# Patient Record
Sex: Male | Born: 1980 | Race: White | Hispanic: No | Marital: Single | Smoking: Never smoker
Health system: Southern US, Community
[De-identification: ages and names within clinical notes are randomized; demographics above are authoritative.]

## PROBLEM LIST (undated history)

## (undated) DIAGNOSIS — D6859 Other primary thrombophilia: Secondary | ICD-10-CM

## (undated) DIAGNOSIS — E119 Type 2 diabetes mellitus without complications: Secondary | ICD-10-CM

## (undated) DIAGNOSIS — I82409 Acute embolism and thrombosis of unspecified deep veins of unspecified lower extremity: Secondary | ICD-10-CM

## (undated) HISTORY — PX: OTHER SURGICAL HISTORY: SHX169

---

## 2007-06-07 ENCOUNTER — Encounter (INDEPENDENT_AMBULATORY_CARE_PROVIDER_SITE_OTHER): Payer: Self-pay | Admitting: *Deleted

## 2012-03-04 LAB — PROTIME-INR

## 2015-10-04 DIAGNOSIS — Z6827 Body mass index (BMI) 27.0-27.9, adult: Secondary | ICD-10-CM | POA: Diagnosis not present

## 2015-10-04 DIAGNOSIS — E782 Mixed hyperlipidemia: Secondary | ICD-10-CM | POA: Diagnosis not present

## 2015-10-04 DIAGNOSIS — D6859 Other primary thrombophilia: Secondary | ICD-10-CM | POA: Diagnosis not present

## 2015-10-04 DIAGNOSIS — E119 Type 2 diabetes mellitus without complications: Secondary | ICD-10-CM | POA: Diagnosis not present

## 2016-01-04 DIAGNOSIS — E782 Mixed hyperlipidemia: Secondary | ICD-10-CM | POA: Diagnosis not present

## 2016-01-04 DIAGNOSIS — Z1389 Encounter for screening for other disorder: Secondary | ICD-10-CM | POA: Diagnosis not present

## 2016-01-04 DIAGNOSIS — Z6827 Body mass index (BMI) 27.0-27.9, adult: Secondary | ICD-10-CM | POA: Diagnosis not present

## 2016-01-04 DIAGNOSIS — E119 Type 2 diabetes mellitus without complications: Secondary | ICD-10-CM | POA: Diagnosis not present

## 2016-04-03 DIAGNOSIS — E119 Type 2 diabetes mellitus without complications: Secondary | ICD-10-CM | POA: Diagnosis not present

## 2016-04-03 DIAGNOSIS — Z6827 Body mass index (BMI) 27.0-27.9, adult: Secondary | ICD-10-CM | POA: Diagnosis not present

## 2016-07-03 DIAGNOSIS — E119 Type 2 diabetes mellitus without complications: Secondary | ICD-10-CM | POA: Diagnosis not present

## 2016-07-03 DIAGNOSIS — Z6827 Body mass index (BMI) 27.0-27.9, adult: Secondary | ICD-10-CM | POA: Diagnosis not present

## 2016-07-03 DIAGNOSIS — E663 Overweight: Secondary | ICD-10-CM | POA: Diagnosis not present

## 2016-07-03 DIAGNOSIS — E7800 Pure hypercholesterolemia, unspecified: Secondary | ICD-10-CM | POA: Diagnosis not present

## 2016-07-07 DIAGNOSIS — E119 Type 2 diabetes mellitus without complications: Secondary | ICD-10-CM | POA: Diagnosis not present

## 2016-08-08 DIAGNOSIS — R748 Abnormal levels of other serum enzymes: Secondary | ICD-10-CM | POA: Diagnosis not present

## 2016-10-02 DIAGNOSIS — E785 Hyperlipidemia, unspecified: Secondary | ICD-10-CM | POA: Diagnosis not present

## 2016-10-02 DIAGNOSIS — E663 Overweight: Secondary | ICD-10-CM | POA: Diagnosis not present

## 2016-10-02 DIAGNOSIS — E119 Type 2 diabetes mellitus without complications: Secondary | ICD-10-CM | POA: Diagnosis not present

## 2016-10-02 DIAGNOSIS — Z6827 Body mass index (BMI) 27.0-27.9, adult: Secondary | ICD-10-CM | POA: Diagnosis not present

## 2017-01-01 DIAGNOSIS — R748 Abnormal levels of other serum enzymes: Secondary | ICD-10-CM | POA: Diagnosis not present

## 2017-01-01 DIAGNOSIS — E7800 Pure hypercholesterolemia, unspecified: Secondary | ICD-10-CM | POA: Diagnosis not present

## 2017-01-01 DIAGNOSIS — R791 Abnormal coagulation profile: Secondary | ICD-10-CM | POA: Diagnosis not present

## 2017-01-01 DIAGNOSIS — E1169 Type 2 diabetes mellitus with other specified complication: Secondary | ICD-10-CM | POA: Diagnosis not present

## 2017-01-03 DIAGNOSIS — E875 Hyperkalemia: Secondary | ICD-10-CM | POA: Diagnosis not present

## 2017-04-05 DIAGNOSIS — E1169 Type 2 diabetes mellitus with other specified complication: Secondary | ICD-10-CM | POA: Diagnosis not present

## 2017-04-05 DIAGNOSIS — J101 Influenza due to other identified influenza virus with other respiratory manifestations: Secondary | ICD-10-CM | POA: Diagnosis not present

## 2017-04-05 DIAGNOSIS — R748 Abnormal levels of other serum enzymes: Secondary | ICD-10-CM | POA: Diagnosis not present

## 2017-04-05 DIAGNOSIS — R791 Abnormal coagulation profile: Secondary | ICD-10-CM | POA: Diagnosis not present

## 2017-04-05 DIAGNOSIS — E7800 Pure hypercholesterolemia, unspecified: Secondary | ICD-10-CM | POA: Diagnosis not present

## 2017-05-02 DIAGNOSIS — Z0000 Encounter for general adult medical examination without abnormal findings: Secondary | ICD-10-CM | POA: Diagnosis not present

## 2017-05-02 DIAGNOSIS — E663 Overweight: Secondary | ICD-10-CM | POA: Diagnosis not present

## 2017-05-02 DIAGNOSIS — Z23 Encounter for immunization: Secondary | ICD-10-CM | POA: Diagnosis not present

## 2017-05-02 DIAGNOSIS — Z1331 Encounter for screening for depression: Secondary | ICD-10-CM | POA: Diagnosis not present

## 2017-07-30 DIAGNOSIS — E1169 Type 2 diabetes mellitus with other specified complication: Secondary | ICD-10-CM | POA: Diagnosis not present

## 2017-07-30 DIAGNOSIS — E7800 Pure hypercholesterolemia, unspecified: Secondary | ICD-10-CM | POA: Diagnosis not present

## 2017-07-30 DIAGNOSIS — R791 Abnormal coagulation profile: Secondary | ICD-10-CM | POA: Diagnosis not present

## 2017-07-30 DIAGNOSIS — R748 Abnormal levels of other serum enzymes: Secondary | ICD-10-CM | POA: Diagnosis not present

## 2017-09-28 DIAGNOSIS — R5381 Other malaise: Secondary | ICD-10-CM | POA: Diagnosis not present

## 2017-09-28 DIAGNOSIS — W57XXXA Bitten or stung by nonvenomous insect and other nonvenomous arthropods, initial encounter: Secondary | ICD-10-CM | POA: Diagnosis not present

## 2017-11-13 DIAGNOSIS — E1169 Type 2 diabetes mellitus with other specified complication: Secondary | ICD-10-CM | POA: Diagnosis not present

## 2017-11-13 DIAGNOSIS — R748 Abnormal levels of other serum enzymes: Secondary | ICD-10-CM | POA: Diagnosis not present

## 2017-11-13 DIAGNOSIS — R791 Abnormal coagulation profile: Secondary | ICD-10-CM | POA: Diagnosis not present

## 2017-11-13 DIAGNOSIS — E7800 Pure hypercholesterolemia, unspecified: Secondary | ICD-10-CM | POA: Diagnosis not present

## 2018-02-13 DIAGNOSIS — Z23 Encounter for immunization: Secondary | ICD-10-CM | POA: Diagnosis not present

## 2018-02-13 DIAGNOSIS — E7800 Pure hypercholesterolemia, unspecified: Secondary | ICD-10-CM | POA: Diagnosis not present

## 2018-02-13 DIAGNOSIS — E1169 Type 2 diabetes mellitus with other specified complication: Secondary | ICD-10-CM | POA: Diagnosis not present

## 2018-02-13 DIAGNOSIS — R791 Abnormal coagulation profile: Secondary | ICD-10-CM | POA: Diagnosis not present

## 2018-10-11 DIAGNOSIS — Z20828 Contact with and (suspected) exposure to other viral communicable diseases: Secondary | ICD-10-CM | POA: Diagnosis not present

## 2018-10-11 DIAGNOSIS — Z03818 Encounter for observation for suspected exposure to other biological agents ruled out: Secondary | ICD-10-CM | POA: Diagnosis not present

## 2019-03-05 DIAGNOSIS — E119 Type 2 diabetes mellitus without complications: Secondary | ICD-10-CM | POA: Diagnosis not present

## 2019-03-28 DIAGNOSIS — Z23 Encounter for immunization: Secondary | ICD-10-CM | POA: Diagnosis not present

## 2019-04-03 DIAGNOSIS — Z6826 Body mass index (BMI) 26.0-26.9, adult: Secondary | ICD-10-CM | POA: Diagnosis not present

## 2019-04-03 DIAGNOSIS — H1030 Unspecified acute conjunctivitis, unspecified eye: Secondary | ICD-10-CM | POA: Diagnosis not present

## 2019-04-14 DIAGNOSIS — Z20828 Contact with and (suspected) exposure to other viral communicable diseases: Secondary | ICD-10-CM | POA: Diagnosis not present

## 2019-04-14 DIAGNOSIS — J3489 Other specified disorders of nose and nasal sinuses: Secondary | ICD-10-CM | POA: Diagnosis not present

## 2019-04-14 DIAGNOSIS — R0981 Nasal congestion: Secondary | ICD-10-CM | POA: Diagnosis not present

## 2019-05-02 DIAGNOSIS — D6859 Other primary thrombophilia: Secondary | ICD-10-CM | POA: Diagnosis not present

## 2019-05-02 DIAGNOSIS — E7800 Pure hypercholesterolemia, unspecified: Secondary | ICD-10-CM | POA: Diagnosis not present

## 2019-05-02 DIAGNOSIS — E1169 Type 2 diabetes mellitus with other specified complication: Secondary | ICD-10-CM | POA: Diagnosis not present

## 2019-05-02 DIAGNOSIS — E1165 Type 2 diabetes mellitus with hyperglycemia: Secondary | ICD-10-CM | POA: Diagnosis not present

## 2019-05-16 DIAGNOSIS — E875 Hyperkalemia: Secondary | ICD-10-CM | POA: Diagnosis not present

## 2019-06-03 DIAGNOSIS — R7989 Other specified abnormal findings of blood chemistry: Secondary | ICD-10-CM | POA: Diagnosis not present

## 2019-08-22 DIAGNOSIS — E1169 Type 2 diabetes mellitus with other specified complication: Secondary | ICD-10-CM | POA: Diagnosis not present

## 2019-08-22 DIAGNOSIS — D6859 Other primary thrombophilia: Secondary | ICD-10-CM | POA: Diagnosis not present

## 2019-08-22 DIAGNOSIS — E7800 Pure hypercholesterolemia, unspecified: Secondary | ICD-10-CM | POA: Diagnosis not present

## 2019-08-22 DIAGNOSIS — E1165 Type 2 diabetes mellitus with hyperglycemia: Secondary | ICD-10-CM | POA: Diagnosis not present

## 2019-08-22 DIAGNOSIS — R12 Heartburn: Secondary | ICD-10-CM | POA: Diagnosis not present

## 2019-11-21 DIAGNOSIS — U071 COVID-19: Secondary | ICD-10-CM | POA: Diagnosis not present

## 2019-11-25 ENCOUNTER — Emergency Department (HOSPITAL_COMMUNITY): Payer: BC Managed Care – PPO

## 2019-11-25 ENCOUNTER — Other Ambulatory Visit: Payer: Self-pay

## 2019-11-25 ENCOUNTER — Inpatient Hospital Stay (HOSPITAL_COMMUNITY)
Admission: EM | Admit: 2019-11-25 | Discharge: 2019-12-01 | DRG: 871 | Disposition: A | Discharge status: Home / Self Care | Payer: BC Managed Care – PPO | Attending: Internal Medicine | Admitting: Internal Medicine

## 2019-11-25 ENCOUNTER — Encounter (HOSPITAL_COMMUNITY): Payer: Self-pay | Admitting: Family Medicine

## 2019-11-25 DIAGNOSIS — D6859 Other primary thrombophilia: Secondary | ICD-10-CM | POA: Diagnosis not present

## 2019-11-25 DIAGNOSIS — A4189 Other specified sepsis: Principal | ICD-10-CM | POA: Diagnosis present

## 2019-11-25 DIAGNOSIS — T380X5A Adverse effect of glucocorticoids and synthetic analogues, initial encounter: Secondary | ICD-10-CM | POA: Diagnosis present

## 2019-11-25 DIAGNOSIS — Z79899 Other long term (current) drug therapy: Secondary | ICD-10-CM | POA: Diagnosis not present

## 2019-11-25 DIAGNOSIS — J9600 Acute respiratory failure, unspecified whether with hypoxia or hypercapnia: Secondary | ICD-10-CM | POA: Diagnosis not present

## 2019-11-25 DIAGNOSIS — J069 Acute upper respiratory infection, unspecified: Secondary | ICD-10-CM

## 2019-11-25 DIAGNOSIS — E1165 Type 2 diabetes mellitus with hyperglycemia: Secondary | ICD-10-CM | POA: Diagnosis not present

## 2019-11-25 DIAGNOSIS — E785 Hyperlipidemia, unspecified: Secondary | ICD-10-CM

## 2019-11-25 DIAGNOSIS — U071 COVID-19: Secondary | ICD-10-CM

## 2019-11-25 DIAGNOSIS — J189 Pneumonia, unspecified organism: Secondary | ICD-10-CM | POA: Diagnosis not present

## 2019-11-25 DIAGNOSIS — R918 Other nonspecific abnormal finding of lung field: Secondary | ICD-10-CM | POA: Diagnosis not present

## 2019-11-25 DIAGNOSIS — J1282 Pneumonia due to coronavirus disease 2019: Secondary | ICD-10-CM

## 2019-11-25 DIAGNOSIS — Z7984 Long term (current) use of oral hypoglycemic drugs: Secondary | ICD-10-CM | POA: Diagnosis not present

## 2019-11-25 DIAGNOSIS — J9601 Acute respiratory failure with hypoxia: Secondary | ICD-10-CM | POA: Diagnosis present

## 2019-11-25 DIAGNOSIS — E1169 Type 2 diabetes mellitus with other specified complication: Secondary | ICD-10-CM | POA: Diagnosis not present

## 2019-11-25 DIAGNOSIS — Z86718 Personal history of other venous thrombosis and embolism: Secondary | ICD-10-CM

## 2019-11-25 DIAGNOSIS — R0602 Shortness of breath: Secondary | ICD-10-CM | POA: Diagnosis not present

## 2019-11-25 DIAGNOSIS — A419 Sepsis, unspecified organism: Secondary | ICD-10-CM | POA: Diagnosis not present

## 2019-11-25 DIAGNOSIS — R000 Tachycardia, unspecified: Secondary | ICD-10-CM | POA: Diagnosis not present

## 2019-11-25 HISTORY — DX: Other primary thrombophilia: D68.59

## 2019-11-25 HISTORY — DX: Acute embolism and thrombosis of unspecified deep veins of unspecified lower extremity: I82.409

## 2019-11-25 HISTORY — DX: Type 2 diabetes mellitus without complications: E11.9

## 2019-11-25 LAB — CBC WITH DIFFERENTIAL/PLATELET
Abs Immature Granulocytes: 0.03 10*3/uL (ref 0.00–0.07)
Basophils Absolute: 0 10*3/uL (ref 0.0–0.1)
Basophils Relative: 0 %
Eosinophils Absolute: 0 10*3/uL (ref 0.0–0.5)
Eosinophils Relative: 0 %
HCT: 45.4 % (ref 39.0–52.0)
Hemoglobin: 15.5 g/dL (ref 13.0–17.0)
Immature Granulocytes: 1 %
Lymphocytes Relative: 10 %
Lymphs Abs: 0.7 10*3/uL (ref 0.7–4.0)
MCH: 28.9 pg (ref 26.0–34.0)
MCHC: 34.1 g/dL (ref 30.0–36.0)
MCV: 84.5 fL (ref 80.0–100.0)
Monocytes Absolute: 0.4 10*3/uL (ref 0.1–1.0)
Monocytes Relative: 6 %
Neutro Abs: 5.4 10*3/uL (ref 1.7–7.7)
Neutrophils Relative %: 83 %
Platelets: 152 10*3/uL (ref 150–400)
RBC: 5.37 MIL/uL (ref 4.22–5.81)
RDW: 11.9 % (ref 11.5–15.5)
WBC: 6.4 10*3/uL (ref 4.0–10.5)
nRBC: 0 % (ref 0.0–0.2)

## 2019-11-25 LAB — COMPREHENSIVE METABOLIC PANEL
ALT: 65 U/L — ABNORMAL HIGH (ref 0–44)
AST: 38 U/L (ref 15–41)
Albumin: 3.5 g/dL (ref 3.5–5.0)
Alkaline Phosphatase: 48 U/L (ref 38–126)
Anion gap: 11 (ref 5–15)
BUN: 13 mg/dL (ref 6–20)
CO2: 25 mmol/L (ref 22–32)
Calcium: 8.4 mg/dL — ABNORMAL LOW (ref 8.9–10.3)
Chloride: 97 mmol/L — ABNORMAL LOW (ref 98–111)
Creatinine, Ser: 0.71 mg/dL (ref 0.61–1.24)
GFR calc Af Amer: >60 mL/min (ref >60)
GFR calc non Af Amer: >60 mL/min (ref >60)
Glucose, Bld: 235 mg/dL — ABNORMAL HIGH (ref 70–99)
Potassium: 4.3 mmol/L (ref 3.5–5.1)
Sodium: 133 mmol/L — ABNORMAL LOW (ref 135–145)
Total Bilirubin: 1 mg/dL (ref 0.3–1.2)
Total Protein: 7 g/dL (ref 6.5–8.1)

## 2019-11-25 LAB — CBG MONITORING, ED: Glucose-Capillary: 316 mg/dL — ABNORMAL HIGH (ref 70–99)

## 2019-11-25 LAB — FIBRINOGEN: Fibrinogen: 629 mg/dL — ABNORMAL HIGH (ref 210–475)

## 2019-11-25 LAB — LACTATE DEHYDROGENASE: LDH: 350 U/L — ABNORMAL HIGH (ref 98–192)

## 2019-11-25 LAB — C-REACTIVE PROTEIN: CRP: 6.1 mg/dL — ABNORMAL HIGH (ref <1.0)

## 2019-11-25 LAB — LACTIC ACID, PLASMA: Lactic Acid, Venous: 1.9 mmol/L (ref 0.5–1.9)

## 2019-11-25 LAB — D-DIMER, QUANTITATIVE: D-Dimer, Quant: 1.24 ug/mL-FEU — ABNORMAL HIGH (ref 0.00–0.50)

## 2019-11-25 LAB — PROCALCITONIN: Procalcitonin: <0.1 ng/mL

## 2019-11-25 LAB — FERRITIN: Ferritin: 766 ng/mL — ABNORMAL HIGH (ref 24–336)

## 2019-11-25 LAB — TRIGLYCERIDES: Triglycerides: 87 mg/dL (ref <150)

## 2019-11-25 MED ORDER — RIVAROXABAN 10 MG PO TABS
10.0000 mg | ORAL_TABLET | Freq: Every day | ORAL | Status: DC
  Administered 2019-11-26: 10 mg via ORAL
  Filled 2019-11-25: qty 1

## 2019-11-25 MED ORDER — ACETAMINOPHEN 325 MG PO TABS: 650.0000 mg | ORAL_TABLET | Freq: Four times a day (QID) | ORAL | Status: DC | PRN

## 2019-11-25 MED ORDER — INSULIN ASPART 100 UNIT/ML ~~LOC~~ SOLN
0.0000 [IU] | Freq: Three times a day (TID) | SUBCUTANEOUS | Status: DC
  Administered 2019-11-26: 5 [IU] via SUBCUTANEOUS
  Filled 2019-11-25: qty 0.15

## 2019-11-25 MED ORDER — INSULIN ASPART 100 UNIT/ML ~~LOC~~ SOLN
0.0000 [IU] | Freq: Every day | SUBCUTANEOUS | Status: DC
  Administered 2019-11-25: 4 [IU] via SUBCUTANEOUS
  Filled 2019-11-25: qty 0.05

## 2019-11-25 MED ORDER — SODIUM CHLORIDE 0.9 % IV SOLN
100.0000 mg | Freq: Every day | INTRAVENOUS | Status: AC
  Administered 2019-11-26 – 2019-11-29 (×4): 100 mg via INTRAVENOUS
  Filled 2019-11-25 (×4): qty 20

## 2019-11-25 MED ORDER — DEXAMETHASONE SODIUM PHOSPHATE 10 MG/ML IJ SOLN
6.0000 mg | INTRAMUSCULAR | Status: DC
  Administered 2019-11-26: 6 mg via INTRAVENOUS
  Filled 2019-11-25: qty 1

## 2019-11-25 MED ORDER — PANTOPRAZOLE SODIUM 40 MG PO TBEC
40.0000 mg | DELAYED_RELEASE_TABLET | Freq: Every day | ORAL | Status: DC
  Administered 2019-11-25 – 2019-12-01 (×7): 40 mg via ORAL
  Filled 2019-11-25 (×7): qty 1

## 2019-11-25 MED ORDER — ACETAMINOPHEN 500 MG PO TABS
1000.0000 mg | ORAL_TABLET | Freq: Once | ORAL | Status: AC
  Administered 2019-11-25: 1000 mg via ORAL
  Filled 2019-11-25: qty 2

## 2019-11-25 MED ORDER — SODIUM CHLORIDE 0.9 % IV SOLN
100.0000 mg | INTRAVENOUS | Status: AC
  Administered 2019-11-25 (×2): 100 mg via INTRAVENOUS
  Filled 2019-11-25 (×2): qty 20

## 2019-11-25 MED ORDER — ENOXAPARIN SODIUM 40 MG/0.4ML ~~LOC~~ SOLN: 40.0000 mg | SUBCUTANEOUS | Status: DC

## 2019-11-25 MED ORDER — ROSUVASTATIN CALCIUM 20 MG PO TABS
20.0000 mg | ORAL_TABLET | Freq: Every day | ORAL | Status: DC
  Administered 2019-11-25 – 2019-11-30 (×6): 20 mg via ORAL
  Filled 2019-11-25 (×6): qty 1

## 2019-11-25 MED ORDER — DEXAMETHASONE SODIUM PHOSPHATE 10 MG/ML IJ SOLN
6.0000 mg | Freq: Once | INTRAMUSCULAR | Status: AC
  Administered 2019-11-25: 6 mg via INTRAVENOUS
  Filled 2019-11-25: qty 1

## 2019-11-25 MED ORDER — SODIUM CHLORIDE 0.9 % IV SOLN: INTRAVENOUS | Status: DC

## 2019-11-25 NOTE — ED Provider Notes (Signed)
[facility]-EMERGENCY DEPT Provider Note   CSN: [id number] Arrival date & time: 11/25/19  1748     History Chief Complaint  Patient presents with  . Shortness of Breath  . COVID 19    Eric Kirby is a 39 y.o. male. 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 Reports that he has had symptoms for approximately 1 week, initially malaise, decreased energy then worsening cough. Over the past couple days has had some increased shortness of breath. Had been monitoring his oxygen levels at home with home pulse oximetry. Initially normal but today they have been dropping down into the 80s. No associated chest pain. Urgent care diagnosed with Covid.  HPI     Past Medical History:  Diagnosis Date  . Diabetes mellitus without complication (HCC)   . DVT (deep venous thrombosis) (HCC)    pt states hx of dvt in leg  . Protein S deficiency [facility])     Patient Active Problem List   Diagnosis Date Noted  . Pneumonia due to COVID-19 virus 11/25/2019  . Acute respiratory failure due to COVID-19 (HCC) 11/25/2019  . Sepsis (HCC) 11/25/2019  . Protein S deficiency (HCC) 11/25/2019  . Type 2 diabetes mellitus with hyperlipidemia (HCC) 11/25/2019    History reviewed. No pertinent family history.  Social History   Tobacco Use  . Smoking status: Never Smoker  . Smokeless tobacco: Never Used  Vaping Use  . Vaping Use: Never used  Substance Use Topics  . Alcohol use: Not Currently  . Drug use: Not Currently    Home Medications Prior to Admission medications   Medication Sig Start Date End Date Taking? Authorizing Provider  albuterol (VENTOLIN HFA) 108 (90 Base) MCG/ACT inhaler Inhale 1 puff into the lungs every 6 (six) hours as needed for wheezing or shortness of breath.   Yes [provider]  glipiZIDE (GLUCOTROL XL) 10 MG 24 hr tablet Take 10 mg by mouth daily. 10/24/19  Yes [provider]  omeprazole (PRILOSEC) 40 MG capsule Take 40 mg by mouth daily. 08/22/19  Yes [provider]  rosuvastatin (CRESTOR) 20 MG tablet Take 20 mg by mouth at bedtime. 11/07/19  Yes [provider]  TRULICITY 4.5 MG/0.5ML SOPN Inject 4.5 mg into the skin once a week.  11/09/19  Yes [provider]    Allergies    Patient has no known allergies.  Review of Systems   Review of Systems  Constitutional: Positive for chills, fatigue and fever.  HENT: Negative for ear pain and sore throat.   Eyes: Negative for pain and visual disturbance.  Respiratory: Positive for cough and shortness of breath.   Cardiovascular: Negative for chest pain and palpitations.  Gastrointestinal: Negative for abdominal pain and vomiting.  Genitourinary: Negative for dysuria and hematuria.  Musculoskeletal: Negative for arthralgias and back pain.  Skin: Negative for color change and rash.  Neurological: Negative for seizures and syncope.  All other systems reviewed and are negative.   Physical Exam Updated Vital Signs BP 128/72   Pulse (!) 108   Temp 99.3 F (37.4 C)   Resp (!) 37   Ht 6' (1.829 m)   Wt 90.8 kg   SpO2 92%   BMI 27.15 kg/m   Physical Exam Vitals and nursing note reviewed.  Constitutional:      Appearance: He is well-developed.  HENT:     Head: Normocephalic and atraumatic.  Eyes:     Conjunctiva/sclera: Conjunctivae normal.  Cardiovascular:     Rate and Rhythm: Regular rhythm. Tachycardia present.     Heart sounds: No murmur heard.   Pulmonary:     Effort: No respiratory distress.     Comments: Somewhat coarse breath sounds at bases, mild tachypnea but no respiratory distress Abdominal:     Palpations: Abdomen is soft.     Tenderness: There is no abdominal tenderness.  Musculoskeletal:     Cervical back: Neck supple.  Skin:    General: Skin is warm and dry.     Capillary Refill: Capillary refill takes less than 2 seconds.  Neurological:       General: No focal deficit present.     Mental Status: He is alert and oriented to person, place, and time.     ED Results / Procedures / Treatments   Labs (all labs ordered are listed, but only abnormal results are displayed) Labs Reviewed  COMPREHENSIVE METABOLIC PANEL - Abnormal; Notable for the following components:      Result Value   Sodium 133 (*)    Chloride 97 (*)    Glucose, Bld 235 (*)    Calcium 8.4 (*)    ALT 65 (*)    All other components within normal limits  LACTATE DEHYDROGENASE - Abnormal; Notable for the following components:   LDH 350 (*)    All other components within normal limits  FERRITIN - Abnormal; Notable for the following components:   Ferritin 766 (*)    All other components within normal limits  C-REACTIVE PROTEIN - Abnormal; Notable for the following components:   CRP 6.1 (*)    All other components within normal limits  D-DIMER, QUANTITATIVE (NOT AT [facility]) - Abnormal; Notable for the following components:   D-Dimer, Quant 1.24 (*)    All other components within normal limits  FIBRINOGEN - Abnormal; Notable for the following components:   Fibrinogen 629 (*)    All other components within normal limits  CBG MONITORING, ED - Abnormal; Notable for the following components:   Glucose-Capillary 316 (*)    All other components within normal limits  SARS CORONAVIRUS 2 BY RT PCR (HOSPITAL ORDER, PERFORMED IN [place] [facility])  CULTURE, BLOOD (ROUTINE X 2)  CULTURE, BLOOD (ROUTINE X 2)  LACTIC ACID, PLASMA  CBC WITH DIFFERENTIAL/PLATELET  PROCALCITONIN  TRIGLYCERIDES  HIV ANTIBODY (ROUTINE TESTING W REFLEX)  D-DIMER, QUANTITATIVE (NOT AT [facility])  C-REACTIVE PROTEIN  COMPREHENSIVE METABOLIC PANEL  CBC WITH DIFFERENTIAL/PLATELET  HEMOGLOBIN A1C  ABO/RH    EKG EKG Interpretation  Date/Time:  Tuesday November 25 2019 18:29:21 EDT Ventricular Rate:  125 PR Interval:    QRS Duration: 100 QT Interval:  296 QTC Calculation: 427 R  Axis:   55 Text Interpretation: Sinus tachycardia Probable left atrial enlargement Confirmed by Marianna Fuss ([id number]) on 11/25/2019 8:53:00 PM   Radiology DG Chest Port 1 View  Result Date: 11/25/2019 CLINICAL DATA:  Shortness of breath. COVID positive. EXAM: PORTABLE CHEST 1 VIEW COMPARISON:  None. FINDINGS: Decreased lung volumes are seen which is likely secondary to the degree of patient inspiration. Mild infiltrate is seen within the left lung base. There is no evidence of a pleural effusion or pneumothorax. The heart size and mediastinal contours are within normal limits. The visualized skeletal structures are unremarkable. IMPRESSION: Mild left basilar infiltrate. Electronically Signed   By: Aram Candela M.D.   On: 11/25/2019 19:24    Procedures .Critical Care Performed  by: Milagros Loll, MD Authorized by: Milagros Loll, MD   Critical care provider statement:    Critical care time (minutes):  35   Critical care was time spent personally by me on the following activities:  Discussions with consultants, evaluation of patient's response to treatment, examination of patient, ordering and performing treatments and interventions, ordering and review of laboratory studies, ordering and review of radiographic studies, pulse oximetry, re-evaluation of patient's condition, obtaining history from patient or surrogate and review of old charts   (including critical care time)  Medications Ordered in ED Medications  dexamethasone (DECADRON) injection 6 mg (has no administration in time range)  acetaminophen (TYLENOL) tablet 650 mg (has no administration in time range)  remdesivir 100 mg in sodium chloride 0.9 % 100 mL IVPB (100 mg Intravenous New Bag/Given (Non-Interop) 11/25/19 2249)  remdesivir 100 mg in sodium chloride 0.9 % 100 mL IVPB (has no administration in time range)  0.9 %  sodium chloride infusion ( Intravenous New Bag/Given 11/25/19 2250)  insulin aspart (novoLOG)  injection 0-15 Units (has no administration in time range)  insulin aspart (novoLOG) injection 0-5 Units (has no administration in time range)  rosuvastatin (CRESTOR) tablet 20 mg (has no administration in time range)  pantoprazole (PROTONIX) EC tablet 40 mg (has no administration in time range)  rivaroxaban (XARELTO) tablet 10 mg (has no administration in time range)  acetaminophen (TYLENOL) tablet 1,000 mg (1,000 mg Oral Given 11/25/19 1950)  dexamethasone (DECADRON) injection 6 mg (6 mg Intravenous Given 11/25/19 1950)    ED Course  I have reviewed the triage vital signs and the nursing notes.  Pertinent labs & imaging results that were available during my care of the patient were reviewed by me and considered in my medical decision making (see chart for details).    MDM Rules/Calculators/A&P                          39 year old male DVT/protein S deficiency, type 2 diabetes presents to ER with worsening shortness of breath in setting of recent Covid diagnosis.  Currently 2L [place]. On exam patient noted to be mildly hypoxic, not in respiratory distress.  CXR consistent with Covid pneumonia.  Started Decadron, consult hospitalist for admission.  Dr. Cyndia Bent accepting.    Final Clinical Impression(s) / ED Diagnoses Final diagnoses:  COVID-19  Acute respiratory failure with hypoxia [facility])    Rx / DC Orders ED Discharge Orders    None       Milagros Loll, MD 11/25/19 2302

## 2019-11-25 NOTE — H&P (Addendum)
History and Physical    Eric Kirby OMB:[id number] DOB: Apr 09, 1981 DOA: 11/25/2019  PCP: Marlyn Corporal, PA  Patient coming from: Home  I have personally briefly reviewed patient's old medical records in [facility]  Chief Complaint: shortness of breath  HPI: Eric Kirby is a 39 y.o. male with medical history significant for type 2 diabetes, protein S deficiency with history of DVT on Xarelto who presents with concerns of worsening shortness of breath.  Patient started noticing fever on 7/26 and generalized weakness.  He progressively felt worse and wife also started to have similar symptoms.  States he got tested for Covid 2 days following that and was positive.  Also began to notice cough and increasing shortness of breath prompting him to come to the ED today.  He denies any nausea, vomiting or diarrhea.  Has been having decreased p.o. intake.  Patient has not received Covid vaccination.  In the ED, he was febrile up to 100.7, tachycardic up to 120s, tachypneic and was hypoxic down to 85% on room air and placed on 4 L of O2 via nasal cannula. Lab work with no leukocytosis. Sodium of 133, glucose of 235. Normal creatinine of 0.71. Mildly elevated ALT of 65 with normal AST. D-dimer 1.24. CRP of 6.1. Procalcitonin of less than 0.10.  Chest x-ray shows mild left basilar infiltrate.   Review of Systems:  Constitutional: No Weight Change, + Fever ENT/Mouth: No sore throat, No Rhinorrhea Eyes: No Eye Pain, No Vision Changes Cardiovascular: No Chest Pain, + SOB, + Dyspnea on Exertion Respiratory: + Cough, No Sputum, + Dyspnea  Gastrointestinal: No Nausea, No Vomiting, No Diarrhea, No Constipation, No Pain Genitourinary: no Urinary Incontinence Musculoskeletal: No Arthralgias, No Myalgias Skin: No Skin Lesions, No Pruritus, Neuro: no Weakness, No Numbness Psych: No Anxiety/Panic, No Depression, + decrease appetite Heme/Lymph: No Bruising, No Bleeding  Past Medical History:    Diagnosis Date  . Diabetes mellitus without complication (HCC)   . DVT (deep venous thrombosis) (HCC)    pt states hx of dvt in leg  . Protein S deficiency [facility]     Surgical history History of right foot fracture repair   reports that he has never smoked. He has never used smokeless tobacco. He reports previous alcohol use. He reports previous drug use. Social History  No Known Allergies  Family history Wife also positive for Covid.  Prior to Admission medications   Medication Sig Start Date End Date Taking? Authorizing Provider  albuterol (VENTOLIN HFA) 108 (90 Base) MCG/ACT inhaler Inhale 1 puff into the lungs every 6 (six) hours as needed for wheezing or shortness of breath.   Yes [provider]  glipiZIDE (GLUCOTROL XL) 10 MG 24 hr tablet Take 10 mg by mouth daily. 10/24/19  Yes [provider]  omeprazole (PRILOSEC) 40 MG capsule Take 40 mg by mouth daily. 08/22/19  Yes [provider]  rosuvastatin (CRESTOR) 20 MG tablet Take 20 mg by mouth at bedtime. 11/07/19  Yes [provider]  TRULICITY 4.5 MG/0.5ML SOPN Inject 4.5 mg into the skin once a week.  11/09/19  Yes [provider]    Physical Exam: Vitals:   11/25/19 1830 11/25/19 1941 11/25/19 2000 11/25/19 2100  BP: 139/88 127/69 128/74 (!) 137/107  Pulse: (!) 125 (!) 123 (!) 121 (!) 108  Resp: (!) 23 (!) 26 19 (!) 32  Temp:    99.3 F (37.4 C)  TempSrc:      SpO2: 96% 92% 93% 93%  Weight:      Height:        Constitutional: NAD, calm, comfortable, nontoxic but ill-appearing diaphoretic male laying at 40 degree incline in bed Vitals:   11/25/19 1830 11/25/19 1941 11/25/19 2000 11/25/19 2100  BP: 139/88 127/69 128/74 (!) 137/107  Pulse: (!) 125 (!) 123 (!) 121 (!) 108  Resp: (!) 23 (!) 26 19 (!) 32  Temp:    99.3 F (37.4 C)  TempSrc:      SpO2: 96% 92% 93% 93%  Weight:      Height:       Eyes: PERRL, lids and conjunctivae normal ENMT: Mucous membranes are  moist.  Neck: normal, supple Respiratory: clear to auscultation bilaterally, no wheezing, no crackles. Normal respiratory effort on 2 L of O2 via nasal cannula. No accessory muscle use.  Cardiovascular: Sinus tachycardia on telemetry, no murmurs / rubs / gallops. No extremity edema. 2+ pedal pulses. No carotid bruits.  Abdomen: no tenderness, no masses palpated.  Bowel sounds positive.  Musculoskeletal: no clubbing / cyanosis. No joint deformity upper and lower extremities. Good ROM, no contractures. Normal muscle tone.  Skin: no rashes, lesions, ulcers. No induration Neurologic: CN 2-12 grossly intact. Sensation intact,Strength 5/5 in all 4.  Psychiatric: Normal judgment and insight. Alert and oriented x 3. Normal mood.    Labs on Admission: I have personally reviewed following labs and imaging studies  CBC: Recent Labs  Lab 11/25/19 1848  WBC 6.4  NEUTROABS 5.4  HGB 15.5  HCT 45.4  MCV 84.5  PLT 152   Basic Metabolic Panel: Recent Labs  Lab 11/25/19 1848  NA 133*  K 4.3  CL 97*  CO2 25  GLUCOSE 235*  BUN 13  CREATININE 0.71  CALCIUM 8.4*   GFR: Estimated Creatinine Clearance: 140.1 mL/min (by C-G formula based on SCr of 0.71 mg/dL). Liver Function Tests: Recent Labs  Lab 11/25/19 1848  AST 38  ALT 65*  ALKPHOS 48  BILITOT 1.0  PROT 7.0  ALBUMIN 3.5   No results for input(s): LIPASE, AMYLASE in the last 168 hours. No results for input(s): AMMONIA in the last 168 hours. Coagulation Profile: No results for input(s): INR, PROTIME in the last 168 hours. Cardiac Enzymes: No results for input(s): CKTOTAL, CKMB, CKMBINDEX, TROPONINI in the last 168 hours. BNP (last 3 results) No results for input(s): PROBNP in the last 8760 hours. HbA1C: No results for input(s): HGBA1C in the last 72 hours. CBG: No results for input(s): GLUCAP in the last 168 hours. Lipid Profile: Recent Labs    11/25/19 1848  TRIG 87   Thyroid Function Tests: No results for input(s):  TSH, T4TOTAL, FREET4, T3FREE, THYROIDAB in the last 72 hours. Anemia Panel: Recent Labs    11/25/19 1848  FERRITIN 766*   Urine analysis: No results found for: COLORURINE, APPEARANCEUR, LABSPEC, PHURINE, GLUCOSEU, HGBUR, BILIRUBINUR, KETONESUR, PROTEINUR, UROBILINOGEN, NITRITE, LEUKOCYTESUR  Radiological Exams on Admission: DG Chest Port 1 View  Result Date: 11/25/2019 CLINICAL DATA:  Shortness of breath. COVID positive. EXAM: PORTABLE CHEST 1 VIEW COMPARISON:  None. FINDINGS: Decreased lung volumes are seen which is likely secondary to the degree of patient inspiration. Mild infiltrate is seen within the left lung base. There is no evidence of a pleural effusion or pneumothorax. The heart size and mediastinal contours are within normal limits. The visualized skeletal structures are unremarkable. IMPRESSION: Mild left basilar infiltrate. Electronically Signed   By: Aram Candela M.D.   On: 11/25/2019 19:24  Assessment/Plan  Acute hypoxic respiratory failure secondary to COVID pneumonia On 2L  Maintain O2 > 92%  IV Decadron  Remdesivir Monitor inflammatory markers- D-dimer is slightly elevated but pt is on Xarelto for protein S deficiency.  Will continue monitor D-dimer trend and hypoxia before considering CTA chest to evaluate for any PE  Sepsis secondary to Covid viral infection Patient is febrile, tachycardic, tachypneic on admission  Protein S deficiency w/hx of DVT Continue Xarelto  Type 2 diabetes Place on moderate sliding scale especially with IV steroid   DVT prophylaxis:.Xarelto  code Status: Full Family Communication: Plan discussed with patient at bedside  disposition Plan: Home with at least 2 midnight stays  Consults called:  Admission status: inpatient requiring at least 2 midnights due to acute hypoxic respiratory failure   Anselm Jungling DO [facility]   If 7PM-7AM, please contact night-coverage [email or web address]   11/25/2019, 9:53 PM

## 2019-11-25 NOTE — ED Notes (Signed)
Monitored pt's oxygen level while ambulating to room.  Pt's oxygen dropped to 85%.  Once in room pt resting oxygen was 88%.

## 2019-11-26 DIAGNOSIS — J9600 Acute respiratory failure, unspecified whether with hypoxia or hypercapnia: Secondary | ICD-10-CM

## 2019-11-26 LAB — CBC WITH DIFFERENTIAL/PLATELET
Abs Immature Granulocytes: 0.03 10*3/uL (ref 0.00–0.07)
Basophils Absolute: 0 10*3/uL (ref 0.0–0.1)
Basophils Relative: 0 %
Eosinophils Absolute: 0 10*3/uL (ref 0.0–0.5)
Eosinophils Relative: 0 %
HCT: 45.2 % (ref 39.0–52.0)
Hemoglobin: 14.9 g/dL (ref 13.0–17.0)
Immature Granulocytes: 1 %
Lymphocytes Relative: 15 %
Lymphs Abs: 0.7 10*3/uL (ref 0.7–4.0)
MCH: 28.2 pg (ref 26.0–34.0)
MCHC: 33 g/dL (ref 30.0–36.0)
MCV: 85.4 fL (ref 80.0–100.0)
Monocytes Absolute: 0.2 10*3/uL (ref 0.1–1.0)
Monocytes Relative: 4 %
Neutro Abs: 3.5 10*3/uL (ref 1.7–7.7)
Neutrophils Relative %: 80 %
Platelets: 150 10*3/uL (ref 150–400)
RBC: 5.29 MIL/uL (ref 4.22–5.81)
RDW: 11.9 % (ref 11.5–15.5)
WBC: 4.4 10*3/uL (ref 4.0–10.5)
nRBC: 0 % (ref 0.0–0.2)

## 2019-11-26 LAB — CBG MONITORING, ED
Glucose-Capillary: 232 mg/dL — ABNORMAL HIGH (ref 70–99)
Glucose-Capillary: 311 mg/dL — ABNORMAL HIGH (ref 70–99)
Glucose-Capillary: 337 mg/dL — ABNORMAL HIGH (ref 70–99)
Glucose-Capillary: 277 mg/dL — ABNORMAL HIGH (ref 70–99)

## 2019-11-26 LAB — HIV ANTIBODY (ROUTINE TESTING W REFLEX): HIV Screen 4th Generation wRfx: NONREACTIVE

## 2019-11-26 LAB — COMPREHENSIVE METABOLIC PANEL
ALT: 61 U/L — ABNORMAL HIGH (ref 0–44)
AST: 30 U/L (ref 15–41)
Albumin: 3.6 g/dL (ref 3.5–5.0)
Alkaline Phosphatase: 50 U/L (ref 38–126)
Anion gap: 11 (ref 5–15)
BUN: 17 mg/dL (ref 6–20)
CO2: 26 mmol/L (ref 22–32)
Calcium: 8.8 mg/dL — ABNORMAL LOW (ref 8.9–10.3)
Chloride: 101 mmol/L (ref 98–111)
Creatinine, Ser: 0.85 mg/dL (ref 0.61–1.24)
GFR calc Af Amer: >60 mL/min (ref >60)
GFR calc non Af Amer: >60 mL/min (ref >60)
Glucose, Bld: 272 mg/dL — ABNORMAL HIGH (ref 70–99)
Potassium: 5.1 mmol/L (ref 3.5–5.1)
Sodium: 138 mmol/L (ref 135–145)
Total Bilirubin: 0.9 mg/dL (ref 0.3–1.2)
Total Protein: 7.1 g/dL (ref 6.5–8.1)

## 2019-11-26 LAB — C-REACTIVE PROTEIN: CRP: 8.2 mg/dL — ABNORMAL HIGH (ref <1.0)

## 2019-11-26 LAB — HEMOGLOBIN A1C
Hgb A1c MFr Bld: 7.6 % — ABNORMAL HIGH (ref 4.8–5.6)
Mean Plasma Glucose: 171.42 mg/dL

## 2019-11-26 LAB — D-DIMER, QUANTITATIVE: D-Dimer, Quant: 1.08 ug/mL-FEU — ABNORMAL HIGH (ref 0.00–0.50)

## 2019-11-26 LAB — SARS CORONAVIRUS 2 BY RT PCR (HOSPITAL ORDER, PERFORMED IN ~~LOC~~ HOSPITAL LAB): SARS Coronavirus 2: POSITIVE — AB

## 2019-11-26 LAB — ABO/RH: ABO/RH(D): O POS

## 2019-11-26 MED ORDER — METHYLPREDNISOLONE SODIUM SUCC 125 MG IJ SOLR
60.0000 mg | Freq: Two times a day (BID) | INTRAMUSCULAR | Status: DC
  Administered 2019-11-26 – 2019-11-28 (×4): 60 mg via INTRAVENOUS
  Filled 2019-11-26 (×3): qty 2

## 2019-11-26 MED ORDER — LINAGLIPTIN 5 MG PO TABS
5.0000 mg | ORAL_TABLET | Freq: Every day | ORAL | Status: DC
  Administered 2019-11-26 – 2019-12-01 (×6): 5 mg via ORAL
  Filled 2019-11-26 (×6): qty 1

## 2019-11-26 MED ORDER — RIVAROXABAN 20 MG PO TABS
20.0000 mg | ORAL_TABLET | Freq: Every day | ORAL | Status: DC
  Administered 2019-11-27 – 2019-12-01 (×5): 20 mg via ORAL
  Filled 2019-11-26 (×5): qty 1

## 2019-11-26 MED ORDER — INSULIN ASPART 100 UNIT/ML ~~LOC~~ SOLN
0.0000 [IU] | Freq: Three times a day (TID) | SUBCUTANEOUS | Status: DC
  Administered 2019-11-26 (×2): 15 [IU] via SUBCUTANEOUS
  Administered 2019-11-27: 20 [IU] via SUBCUTANEOUS
  Administered 2019-11-27: 15 [IU] via SUBCUTANEOUS
  Administered 2019-11-27 – 2019-11-28 (×2): 11 [IU] via SUBCUTANEOUS
  Administered 2019-11-28: 15 [IU] via SUBCUTANEOUS
  Administered 2019-11-28: 11 [IU] via SUBCUTANEOUS
  Administered 2019-11-29 (×2): 15 [IU] via SUBCUTANEOUS
  Administered 2019-11-29: 11 [IU] via SUBCUTANEOUS
  Administered 2019-11-30: 4 [IU] via SUBCUTANEOUS
  Administered 2019-11-30: 11 [IU] via SUBCUTANEOUS
  Administered 2019-11-30: 20 [IU] via SUBCUTANEOUS
  Administered 2019-12-01 (×2): 4 [IU] via SUBCUTANEOUS
  Filled 2019-11-26: qty 0.2

## 2019-11-26 MED ORDER — INSULIN ASPART 100 UNIT/ML ~~LOC~~ SOLN
0.0000 [IU] | Freq: Every day | SUBCUTANEOUS | Status: DC
  Administered 2019-11-26 – 2019-11-27 (×2): 3 [IU] via SUBCUTANEOUS
  Administered 2019-11-28: 5 [IU] via SUBCUTANEOUS
  Administered 2019-11-29: 3 [IU] via SUBCUTANEOUS
  Filled 2019-11-26: qty 0.05

## 2019-11-26 MED ORDER — INSULIN DETEMIR 100 UNIT/ML ~~LOC~~ SOLN
5.0000 [IU] | Freq: Every day | SUBCUTANEOUS | Status: DC
  Administered 2019-11-26: 5 [IU] via SUBCUTANEOUS
  Filled 2019-11-26 (×2): qty 0.05

## 2019-11-26 MED ORDER — RIVAROXABAN 10 MG PO TABS
10.0000 mg | ORAL_TABLET | Freq: Once | ORAL | Status: AC
  Administered 2019-11-26: 10 mg via ORAL
  Filled 2019-11-26: qty 1

## 2019-11-26 NOTE — ED Notes (Signed)
Patient o2 saturation dropped between 85-87% and would not go back up. This RN switched patient to simple mask at 7L.

## 2019-11-26 NOTE — Progress Notes (Signed)
Inpatient Diabetes Program Recommendations  AACE/ADA: New Consensus Statement on Inpatient Glycemic Control (2015)  Target Ranges:  Prepandial:   less than 140 mg/dL      Peak postprandial:   less than 180 mg/dL (1-2 hours)      Critically ill patients:  140 - 180 mg/dL   Lab Results  Component Value Date   GLUCAP 232 (H) 11/26/2019   HGBA1C 7.6 (H) 11/25/2019    Review of Glycemic Control Results for Eric Kirby, MITCHAM (MRN [id number]) as of 11/26/2019 09:01  Ref. Range 11/25/2019 22:47 11/26/2019 07:48  Glucose-Capillary Latest Ref Range: 70 - 99 mg/dL 235 (H) 361 (H)   Diabetes history: DM 2 Outpatient Diabetes medications: Glipizide 10 mg Daily, Trulicity 4.5 mg weekly Current orders for Inpatient glycemic control:  Novolog 0-15 units tid + hs  Decadron 6 mg Q24 hours  Inpatient Diabetes Program Recommendations:    Received 2 doses of decadron...  -Consider Levemir 5 units bid -Add Tradjenta 5 mg Daily  Thanks,  Christena Deem RN, MSN, BC-ADM Inpatient Diabetes Coordinator Team Pager [phone] (8a-5p)

## 2019-11-26 NOTE — Progress Notes (Signed)
PROGRESS NOTE  Eric Kirby  KZL:[id number] DOB: 09/09/1980 DOA: 11/25/2019 PCP: Marlyn Corporal, PA  Brief Narrative: Eric Kirby is a 39 y.o. male with a history of T2DM, protein S deficiency, DVT on xarelto who presented with shortness of breath having tested positive for SARS-CoV-2.   In the ED, he was febrile up to 100.31F, tachycardic up to 120s, tachypneic and was hypoxic down to 85% on room air and placed on 4 L of O2 via nasal cannula. Lab work with no leukocytosis. Sodium of 133, glucose of 235. Normal creatinine of 0.71. Mildly elevated ALT of 65 with normal AST. D-dimer 1.24. CRP of 6.1. Procalcitonin of less than 0.10. Chest x-ray shows mild left basilar infiltrate.  Assessment & Plan: Principal Problem:   Pneumonia due to COVID-19 virus Active Problems:   Acute respiratory failure due to COVID-19 (HCC)   Sepsis (HCC)   Protein S deficiency (HCC)   Type 2 diabetes mellitus with hyperlipidemia (HCC)  Acute hypoxemic respiratory failure and sepsis (POA w/fever, tachycardia, tachypnea) due to covid-19 pneumonia: SARS-CoV-2 PCR positive confirmed 8/3.  - Continue remdesivir x5 days - Steroids x10 days - No indication for tocilizumab at this time.  - Vitamin C, zinc - Encourage OOB, IS, FV, and awake proning if able - Tylenol and antitussives prn - Continue airborne, contact precautions for 21 days from positive testing. - Check CBC w/diff, CMP, CRP daily - Xarelto - Maintain euvolemia/net negative. Tolerating po, stop IVF.  T2DM with steroid-induced hyperglycemia: HbA1c 7.6%. - Augment to resistant SSI, add levemir and linagliptin  Protein S deficiency, history of DVT:  - Continue xarelto without interruption  DVT prophylaxis: Xarelto Code Status: Full Family Communication: None at bedside Disposition Plan:  Status is: Inpatient  Remains inpatient appropriate because:Inpatient level of care appropriate due to severity of illness   Dispo: The patient is  from: Home              Anticipated d/c is to: Home              Anticipated d/c date is: 3 days              Patient currently is not medically stable to d/c.  Consultants:   None  Procedures:   None  Antimicrobials:  Remdesivir   Subjective: Shortness of breath is stable, moderate, present at rest, worse with exertion. No chest pain or leg swelling.  Objective: Vitals:   11/26/19 0700 11/26/19 0811 11/26/19 0848 11/26/19 1001  BP: 120/77 122/82 122/82 136/79  Pulse: 83 83 94 92  Resp: (!) 21 (!) 21 (!) 23 20  Temp:      TempSrc:      SpO2: 94% 92% 92% 91%  Weight:      Height:        Intake/Output Summary (Last 24 hours) at 11/26/2019 1005 Last data filed at 11/26/2019 3903 Gross per 24 hour  Intake 1467.01 ml  Output 1250 ml  Net 217.01 ml   Filed Weights   11/25/19 1806  Weight: 90.8 kg    Gen: 39 y.o. male in no distress Pulm: Non-labored breathing supplemental oxygen at rest, remains tachypneic at rest. Minimal crackles bilaterally, no wheezes CV: Regular rate and rhythm. No murmur, rub, or gallop. No JVD, no pedal edema. GI: Abdomen soft, non-tender, non-distended, with normoactive bowel sounds. No organomegaly or masses felt. Ext: Warm, no deformities Skin: No rashes, lesions or ulcers Neuro: Alert and oriented. No focal neurological deficits. Psych: Judgement and  insight appear normal. Mood & affect appropriate.   Data Reviewed: I have personally reviewed following labs and imaging studies  CBC: Recent Labs  Lab 11/25/19 1848 11/26/19 0439  WBC 6.4 4.4  NEUTROABS 5.4 3.5  HGB 15.5 14.9  HCT 45.4 45.2  MCV 84.5 85.4  PLT 152 150   Basic Metabolic Panel: Recent Labs  Lab 11/25/19 1848 11/26/19 0439  NA 133* 138  K 4.3 5.1  CL 97* 101  CO2 25 26  GLUCOSE 235* 272*  BUN 13 17  CREATININE 0.71 0.85  CALCIUM 8.4* 8.8*   GFR: Estimated Creatinine Clearance: 131.9 mL/min (by C-G formula based on SCr of 0.85 mg/dL). Liver Function  Tests: Recent Labs  Lab 11/25/19 1848 11/26/19 0439  AST 38 30  ALT 65* 61*  ALKPHOS 48 50  BILITOT 1.0 0.9  PROT 7.0 7.1  ALBUMIN 3.5 3.6   No results for input(s): LIPASE, AMYLASE in the last 168 hours. No results for input(s): AMMONIA in the last 168 hours. Coagulation Profile: No results for input(s): INR, PROTIME in the last 168 hours. Cardiac Enzymes: No results for input(s): CKTOTAL, CKMB, CKMBINDEX, TROPONINI in the last 168 hours. BNP (last 3 results) No results for input(s): PROBNP in the last 8760 hours. HbA1C: Recent Labs    11/25/19 2159  HGBA1C 7.6*   CBG: Recent Labs  Lab 11/25/19 2247 11/26/19 0748  GLUCAP 316* 232*   Lipid Profile: Recent Labs    11/25/19 1848  TRIG 87   Thyroid Function Tests: No results for input(s): TSH, T4TOTAL, FREET4, T3FREE, THYROIDAB in the last 72 hours. Anemia Panel: Recent Labs    11/25/19 1848  FERRITIN 766*   Urine analysis: No results found for: COLORURINE, APPEARANCEUR, LABSPEC, PHURINE, GLUCOSEU, HGBUR, BILIRUBINUR, KETONESUR, PROTEINUR, UROBILINOGEN, NITRITE, LEUKOCYTESUR Recent Results (from the past 240 hour(s))  Blood Culture (routine x 2)     Status: None (Preliminary result)   Collection Time: 11/25/19  6:48 PM   Specimen: BLOOD  Result Value Ref Range Status   Specimen Description   Final    BLOOD LEFT ANTECUBITAL Performed at [facility], [address], [place], [place] [address]    Special Requests   Final    BOTTLES DRAWN AEROBIC AND ANAEROBIC Blood Culture adequate volume Performed at [facility], [address] [place], [place] [address]    Culture   Final    NO GROWTH < 12 HOURS Performed at [facility], [address] [place], [place] [address]    Report Status PENDING  Incomplete  SARS Coronavirus 2 by RT PCR (hospital order, performed in [facility] hospital lab) Nasopharyngeal Nasopharyngeal Swab     Status: Abnormal   Collection  Time: 11/25/19  9:30 PM   Specimen: Nasopharyngeal Swab  Result Value Ref Range Status   SARS Coronavirus 2 POSITIVE (A) NEGATIVE Final    Comment: RESULT CALLED TO, READ BACK BY AND VERIFIED WITH: RN H LACIVITA AT 008 11/26/19 CRUICKSHANK A (NOTE) SARS-CoV-2 target nucleic acids are DETECTED  SARS-CoV-2 RNA is generally detectable in upper respiratory specimens  during the acute phase of infection.  Positive results are indicative  of the presence of the identified virus, but do not rule out bacterial infection or co-infection with other pathogens not detected by the test.  Clinical correlation with patient history and  other diagnostic information is necessary to determine patient infection status.  The expected result is negative.  Fact Sheet for Patients:  BoilerBrush.com.cy   Fact Sheet for Healthcare Providers:   https://pope.com/    This test is not yet approved or cleared by the [place] FDA and  has been authorized for detection and/or diagnosis of SARS-CoV-2 by FDA under an Emergency Use Authorization (EUA).  This EUA will remain in effect (meaning  this test can be used) for the duration of  the COVID-19 declaration under Section 564(b)(1) of the Act, 21 U.S.C. section 360-bbb-3(b)(1), unless the authorization is terminated or revoked sooner.  Performed at [facility], [address], [place], [place] [address]       Radiology Studies: [facility] Chest Port 1 View  Result Date: 11/25/2019 CLINICAL DATA:  Shortness of breath. COVID positive. EXAM: PORTABLE CHEST 1 VIEW COMPARISON:  None. FINDINGS: Decreased lung volumes are seen which is likely secondary to the degree of patient inspiration. Mild infiltrate is seen within the left lung base. There is no evidence of a pleural effusion or pneumothorax. The heart size and mediastinal contours are within normal limits. The visualized skeletal structures are  unremarkable. IMPRESSION: Mild left basilar infiltrate. Electronically Signed   By: Aram Candela M.D.   On: 11/25/2019 19:24    Scheduled Meds: . dexamethasone (DECADRON) injection  6 mg Intravenous Q24H  . insulin aspart  0-20 Units Subcutaneous TID WC  . insulin aspart  0-5 Units Subcutaneous QHS  . insulin detemir  5 Units Subcutaneous Daily  . linagliptin  5 mg Oral Daily  . pantoprazole  40 mg Oral Daily  . rivaroxaban  10 mg Oral Daily  . rosuvastatin  20 mg Oral QHS   Continuous Infusions: . remdesivir 100 mg in NS 100 mL 100 mg (11/26/19 0925)     LOS: 1 day   Time spent: 25 minutes.  Tyrone Nine, MD [facility] [email or web address] 11/26/2019, 10:05 AM

## 2019-11-27 LAB — CBC WITH DIFFERENTIAL/PLATELET
Abs Immature Granulocytes: 0.07 10*3/uL (ref 0.00–0.07)
Basophils Absolute: 0 10*3/uL (ref 0.0–0.1)
Basophils Relative: 0 %
Eosinophils Absolute: 0 10*3/uL (ref 0.0–0.5)
Eosinophils Relative: 0 %
HCT: 45.9 % (ref 39.0–52.0)
Hemoglobin: 15.3 g/dL (ref 13.0–17.0)
Immature Granulocytes: 1 %
Lymphocytes Relative: 14 %
Lymphs Abs: 1 10*3/uL (ref 0.7–4.0)
MCH: 28.1 pg (ref 26.0–34.0)
MCHC: 33.3 g/dL (ref 30.0–36.0)
MCV: 84.4 fL (ref 80.0–100.0)
Monocytes Absolute: 0.3 10*3/uL (ref 0.1–1.0)
Monocytes Relative: 4 %
Neutro Abs: 5.8 10*3/uL (ref 1.7–7.7)
Neutrophils Relative %: 81 %
Platelets: 194 10*3/uL (ref 150–400)
RBC: 5.44 MIL/uL (ref 4.22–5.81)
RDW: 12.2 % (ref 11.5–15.5)
WBC: 7.2 10*3/uL (ref 4.0–10.5)
nRBC: 0 % (ref 0.0–0.2)

## 2019-11-27 LAB — COMPREHENSIVE METABOLIC PANEL
ALT: 55 U/L — ABNORMAL HIGH (ref 0–44)
AST: 25 U/L (ref 15–41)
Albumin: 3.5 g/dL (ref 3.5–5.0)
Alkaline Phosphatase: 49 U/L (ref 38–126)
Anion gap: 12 (ref 5–15)
BUN: 24 mg/dL — ABNORMAL HIGH (ref 6–20)
CO2: 22 mmol/L (ref 22–32)
Calcium: 8.9 mg/dL (ref 8.9–10.3)
Chloride: 101 mmol/L (ref 98–111)
Creatinine, Ser: 0.74 mg/dL (ref 0.61–1.24)
GFR calc Af Amer: >60 mL/min (ref >60)
GFR calc non Af Amer: >60 mL/min (ref >60)
Glucose, Bld: 341 mg/dL — ABNORMAL HIGH (ref 70–99)
Potassium: 4.7 mmol/L (ref 3.5–5.1)
Sodium: 135 mmol/L (ref 135–145)
Total Bilirubin: 0.8 mg/dL (ref 0.3–1.2)
Total Protein: 6.9 g/dL (ref 6.5–8.1)

## 2019-11-27 LAB — CBG MONITORING, ED
Glucose-Capillary: 310 mg/dL — ABNORMAL HIGH (ref 70–99)
Glucose-Capillary: 400 mg/dL — ABNORMAL HIGH (ref 70–99)

## 2019-11-27 LAB — GLUCOSE, CAPILLARY
Glucose-Capillary: 280 mg/dL — ABNORMAL HIGH (ref 70–99)
Glucose-Capillary: 282 mg/dL — ABNORMAL HIGH (ref 70–99)

## 2019-11-27 LAB — MRSA PCR SCREENING: MRSA by PCR: NEGATIVE

## 2019-11-27 LAB — C-REACTIVE PROTEIN: CRP: 4.1 mg/dL — ABNORMAL HIGH (ref <1.0)

## 2019-11-27 LAB — D-DIMER, QUANTITATIVE: D-Dimer, Quant: 1.14 ug/mL-FEU — ABNORMAL HIGH (ref 0.00–0.50)

## 2019-11-27 MED ORDER — INSULIN DETEMIR 100 UNIT/ML ~~LOC~~ SOLN
15.0000 [IU] | Freq: Every day | SUBCUTANEOUS | Status: DC
  Administered 2019-11-27: 15 [IU] via SUBCUTANEOUS
  Filled 2019-11-27: qty 0.15

## 2019-11-27 MED ORDER — TOCILIZUMAB 400 MG/20ML IV SOLN
800.0000 mg | Freq: Once | INTRAVENOUS | Status: AC
  Administered 2019-11-27: 800 mg via INTRAVENOUS
  Filled 2019-11-27: qty 40

## 2019-11-27 MED ORDER — CHLORHEXIDINE GLUCONATE CLOTH 2 % EX PADS
6.0000 | MEDICATED_PAD | Freq: Every day | CUTANEOUS | Status: DC
  Administered 2019-11-27 – 2019-11-30 (×4): 6 via TOPICAL

## 2019-11-27 MED ORDER — INSULIN DETEMIR 100 UNIT/ML ~~LOC~~ SOLN
15.0000 [IU] | Freq: Two times a day (BID) | SUBCUTANEOUS | Status: DC
  Administered 2019-11-27: 15 [IU] via SUBCUTANEOUS
  Filled 2019-11-27 (×2): qty 0.15

## 2019-11-27 MED ORDER — ORAL CARE MOUTH RINSE
15.0000 mL | Freq: Two times a day (BID) | OROMUCOSAL | Status: DC
  Administered 2019-11-27 – 2019-12-01 (×8): 15 mL via OROMUCOSAL

## 2019-11-27 MED ORDER — INSULIN ASPART 100 UNIT/ML ~~LOC~~ SOLN
8.0000 [IU] | Freq: Three times a day (TID) | SUBCUTANEOUS | Status: DC
  Administered 2019-11-27 – 2019-11-28 (×3): 8 [IU] via SUBCUTANEOUS
  Filled 2019-11-27: qty 0.08

## 2019-11-27 MED ORDER — INSULIN ASPART 100 UNIT/ML ~~LOC~~ SOLN
4.0000 [IU] | Freq: Three times a day (TID) | SUBCUTANEOUS | Status: DC
  Administered 2019-11-27 (×2): 4 [IU] via SUBCUTANEOUS
  Filled 2019-11-27: qty 0.04

## 2019-11-27 NOTE — ED Notes (Signed)
Attempted to call report to the ICU, no response, no purple man.

## 2019-11-27 NOTE — ED Notes (Signed)
No purple man

## 2019-11-27 NOTE — ED Notes (Signed)
Attempted to give report to charge RN and Gavin Pound, RN, both are unavailable and will call back when done with pt care.

## 2019-11-27 NOTE — ED Notes (Signed)
Report given to Deborah, RN

## 2019-11-27 NOTE — Progress Notes (Signed)
PROGRESS NOTE  Rupert Azzara  EUM:[id number] DOB: 08/17/1980 DOA: 11/25/2019 PCP: Marlyn Corporal, PA  Brief Narrative: Maninder Deboer is a 39 y.o. male with a history of T2DM, protein S deficiency, DVT on xarelto who presented with shortness of breath having tested positive for SARS-CoV-2.   In the ED, he was febrile up to 100.49F, tachycardic up to 120s, tachypneic and was hypoxic down to 85% on room air and placed on 4 L of O2 via nasal cannula. Lab work with no leukocytosis. Sodium of 133, glucose of 235. Normal creatinine of 0.71. Mildly elevated ALT of 65 with normal AST. D-dimer 1.24. CRP of 6.1. Procalcitonin of less than 0.10. Chest x-ray shows mild left basilar infiltrate.  Assessment & Plan: Principal Problem:   Pneumonia due to COVID-19 virus Active Problems:   Acute respiratory failure due to COVID-19 (HCC)   Sepsis (HCC)   Protein S deficiency (HCC)   Type 2 diabetes mellitus with hyperlipidemia (HCC)  Acute hypoxemic respiratory failure and sepsis (POA w/fever, tachycardia, tachypnea) due to covid-19 pneumonia: SARS-CoV-2 PCR positive confirmed 8/3.  - Continue supplemental oxygen to maintain SpO2 >89% and normal respiratory effort. Patient's hypoxemia has worsened over past 24 hrs. Discussed EUA off-label use of tocilizumab in detail. The patient qualifies for, and consents to, administration. 800mg  IV x1 ordered this AM. - Continue remdesivir x5 days - Steroids x10 days - No indication for tocilizumab at this time.  - Vitamin C, zinc - Encourage OOB, IS, FV, and awake proning if able - Tylenol and antitussives prn - Continue airborne, contact precautions for 21 days from positive testing. - Check CBC w/diff, CMP, CRP daily - Xarelto  T2DM with steroid-induced hyperglycemia: HbA1c 7.6%. - Severe and worsening hyperglycemia. Add 4u mealtime coverage > up to 8u starting this evening. Continue resistant SSI.  - Started levemir 5u > will change to 15u BID, continue new  linagliptin  Protein S deficiency, history of DVT:  - Continue xarelto without interruption  DVT prophylaxis: Xarelto Code Status: Full Family Communication: None at bedside Disposition Plan:  Status is: Inpatient  Remains inpatient appropriate because:Inpatient level of care appropriate due to severity of illness   Dispo: The patient is from: Home              Anticipated d/c is to: Home              Anticipated d/c date is: 3 days              Patient currently is not medically stable to d/c.  Consultants:   None  Procedures:   None  Antimicrobials:  Remdesivir   Subjective: Oxygen saturations on monitor continue to get lower requiring simple mask and increased oxygen flow. Gets short of breath with 2 step transfers, no chest pain. No hx hepatitis, TB, immunocompromise.  Objective: Vitals:   11/27/19 1130 11/27/19 1202 11/27/19 1230 11/27/19 1303  BP: 126/77 131/80 130/85 118/71  Pulse: 93 100 93 94  Resp:  (!) 23 (!) 24 (!) 21  Temp:  97.8 F (36.6 C)  97.8 F (36.6 C)  TempSrc:  Oral  Oral  SpO2: 90% (!) 86% 93% 91%  Weight:      Height:        Intake/Output Summary (Last 24 hours) at 11/27/2019 1343 Last data filed at 11/27/2019 1151 Gross per 24 hour  Intake 200 ml  Output --  Net 200 ml   Filed Weights   11/25/19 1806  Weight: 90.8  kg    Gen: 39 y.o. male in no distress Pulm: Tachypneic despite supplemental oxygen, crackles bilaterally.  CV: Regular rate and rhythm. No murmur, rub, or gallop. No JVD, no dependent edema. GI: Abdomen soft, non-tender, non-distended, with normoactive bowel sounds.  Ext: Warm, no deformities Skin: No rashes, lesions or ulcers on visualized skin. Neuro: Alert and oriented. No focal neurological deficits. Psych: Judgement and insight appear fair. Mood euthymic & affect congruent. Behavior is appropriate.    Data Reviewed: I have personally reviewed following labs and imaging studies  CBC: Recent Labs  Lab  11/25/19 1848 11/26/19 0439 11/27/19 0549  WBC 6.4 4.4 7.2  NEUTROABS 5.4 3.5 5.8  HGB 15.5 14.9 15.3  HCT 45.4 45.2 45.9  MCV 84.5 85.4 84.4  PLT 152 150 194   Basic Metabolic Panel: Recent Labs  Lab 11/25/19 1848 11/26/19 0439 11/27/19 0549  NA 133* 138 135  K 4.3 5.1 4.7  CL 97* 101 101  CO2 25 26 22   GLUCOSE 235* 272* 341*  BUN 13 17 24*  CREATININE 0.71 0.85 0.74  CALCIUM 8.4* 8.8* 8.9   GFR: Estimated Creatinine Clearance: 140.1 mL/min (by C-G formula based on SCr of 0.74 mg/dL). Liver Function Tests: Recent Labs  Lab 11/25/19 1848 11/26/19 0439 11/27/19 0549  AST 38 30 25  ALT 65* 61* 55*  ALKPHOS 48 50 49  BILITOT 1.0 0.9 0.8  PROT 7.0 7.1 6.9  ALBUMIN 3.5 3.6 3.5   No results for input(s): LIPASE, AMYLASE in the last 168 hours. No results for input(s): AMMONIA in the last 168 hours. Coagulation Profile: No results for input(s): INR, PROTIME in the last 168 hours. Cardiac Enzymes: No results for input(s): CKTOTAL, CKMB, CKMBINDEX, TROPONINI in the last 168 hours. BNP (last 3 results) No results for input(s): PROBNP in the last 8760 hours. HbA1C: Recent Labs    11/25/19 2159  HGBA1C 7.6*   CBG: Recent Labs  Lab 11/26/19 1139 11/26/19 1724 11/26/19 2200 11/27/19 0801 11/27/19 1154  GLUCAP 311* 337* 277* 310* 400*   Lipid Profile: Recent Labs    11/25/19 1848  TRIG 87   Thyroid Function Tests: No results for input(s): TSH, T4TOTAL, FREET4, T3FREE, THYROIDAB in the last 72 hours. Anemia Panel: Recent Labs    11/25/19 1848  FERRITIN 766*   Urine analysis: No results found for: COLORURINE, APPEARANCEUR, LABSPEC, PHURINE, GLUCOSEU, HGBUR, BILIRUBINUR, KETONESUR, PROTEINUR, UROBILINOGEN, NITRITE, LEUKOCYTESUR Recent Results (from the past 240 hour(s))  Blood Culture (routine x 2)     Status: None (Preliminary result)   Collection Time: 11/25/19  6:48 PM   Specimen: BLOOD  Result Value Ref Range Status   Specimen Description   Final     BLOOD LEFT ANTECUBITAL Performed at [facility], [address], [place], [place]    Special Requests   Final    BOTTLES DRAWN AEROBIC AND ANAEROBIC Blood Culture adequate volume Performed at [facility], [address], [place], [place]    Culture   Final    NO GROWTH < 12 HOURS Performed at [facility], [address], [place], [place]    Report Status PENDING  Incomplete  SARS Coronavirus 2 by RT PCR (hospital order, performed in [facility] hospital lab) Nasopharyngeal Nasopharyngeal Swab     Status: Abnormal   Collection Time: 11/25/19  9:30 PM   Specimen: Nasopharyngeal Swab  Result Value Ref Range Status   SARS Coronavirus 2 POSITIVE (  A) NEGATIVE Final    Comment: RESULT CALLED TO, READ BACK BY AND VERIFIED WITH: RN H LACIVITA AT 008 11/26/19 CRUICKSHANK A (NOTE) SARS-CoV-2 target nucleic acids are DETECTED  SARS-CoV-2 RNA is generally detectable in upper respiratory specimens  during the acute phase of infection.  Positive results are indicative  of the presence of the identified virus, but do not rule out bacterial infection or co-infection with other pathogens not detected by the test.  Clinical correlation with patient history and  other diagnostic information is necessary to determine patient infection status.  The expected result is negative.  Fact Sheet for Patients:   BoilerBrush.com.cy   Fact Sheet for Healthcare Providers:   https://pope.com/    This test is not yet approved or cleared by the [place] FDA and  has been authorized for detection and/or diagnosis of SARS-CoV-2 by FDA under an Emergency Use Authorization (EUA).  This EUA will remain in effect (meaning  this test can be used) for the duration of  the COVID-19 declaration under Section 564(b)(1) of the Act, 21 U.S.C. section 360-bbb-3(b)(1), unless the authorization  is terminated or revoked sooner.  Performed at [facility], [address], [place], [place] [address]       Radiology Studies: [facility] Chest Port 1 View  Result Date: 11/25/2019 CLINICAL DATA:  Shortness of breath. COVID positive. EXAM: PORTABLE CHEST 1 VIEW COMPARISON:  None. FINDINGS: Decreased lung volumes are seen which is likely secondary to the degree of patient inspiration. Mild infiltrate is seen within the left lung base. There is no evidence of a pleural effusion or pneumothorax. The heart size and mediastinal contours are within normal limits. The visualized skeletal structures are unremarkable. IMPRESSION: Mild left basilar infiltrate. Electronically Signed   By: Aram Candela M.D.   On: 11/25/2019 19:24    Scheduled Meds: . insulin aspart  0-20 Units Subcutaneous TID WC  . insulin aspart  0-5 Units Subcutaneous QHS  . insulin aspart  8 Units Subcutaneous TID WC  . insulin detemir  15 Units Subcutaneous BID  . linagliptin  5 mg Oral Daily  . methylPREDNISolone (SOLU-MEDROL) injection  60 mg Intravenous Q12H  . pantoprazole  40 mg Oral Daily  . rivaroxaban  20 mg Oral Q breakfast  . rosuvastatin  20 mg Oral QHS   Continuous Infusions: . remdesivir 100 mg in NS 100 mL Stopped (11/27/19 1034)     LOS: 2 days   Time spent: 35 minutes.  Tyrone Nine, MD [facility] [email or web address] 11/27/2019, 1:43 PM

## 2019-11-27 NOTE — Progress Notes (Addendum)
Inpatient Diabetes Program Recommendations  AACE/ADA: New Consensus Statement on Inpatient Glycemic Control (2015)  Target Ranges:  Prepandial:   less than 140 mg/dL      Peak postprandial:   less than 180 mg/dL (1-2 hours)      Critically ill patients:  140 - 180 mg/dL   Lab Results  Component Value Date   GLUCAP 277 (H) 11/26/2019   HGBA1C 7.6 (H) 11/25/2019    Review of Glycemic Control Results for Eric Kirby, Eric Kirby (MRN [id number]) as of 11/27/2019 07:53  Ref. Range 11/27/2019 05:49  Glucose Latest Ref Range: 70 - 99 mg/dL 282 (H)    Diabetes history: DM 2 Outpatient Diabetes medications: Glipizide 10 mg Daily, Trulicity 4.5 mg weekly Current orders for Inpatient glycemic control:  Novolog 0-20 units tid + hs Novolog 4 units tid mc Levemir 15 units Daily  Solumedrol 60 mg Q12 hours  Inpatient Diabetes Program Recommendations:    -Consider Levemir 15 units bid    Thanks,  Christena Deem RN, MSN, BC-ADM Inpatient Diabetes Coordinator Team Pager [phone] (8a-5p)

## 2019-11-28 ENCOUNTER — Inpatient Hospital Stay (HOSPITAL_COMMUNITY): Payer: BC Managed Care – PPO

## 2019-11-28 LAB — GLUCOSE, CAPILLARY
Glucose-Capillary: 298 mg/dL — ABNORMAL HIGH (ref 70–99)
Glucose-Capillary: 336 mg/dL — ABNORMAL HIGH (ref 70–99)
Glucose-Capillary: 291 mg/dL — ABNORMAL HIGH (ref 70–99)
Glucose-Capillary: 386 mg/dL — ABNORMAL HIGH (ref 70–99)

## 2019-11-28 LAB — CBC WITH DIFFERENTIAL/PLATELET
Abs Immature Granulocytes: 0.12 10*3/uL — ABNORMAL HIGH (ref 0.00–0.07)
Basophils Absolute: 0 10*3/uL (ref 0.0–0.1)
Basophils Relative: 0 %
Eosinophils Absolute: 0 10*3/uL (ref 0.0–0.5)
Eosinophils Relative: 0 %
HCT: 42.6 % (ref 39.0–52.0)
Hemoglobin: 14.4 g/dL (ref 13.0–17.0)
Immature Granulocytes: 1 %
Lymphocytes Relative: 13 %
Lymphs Abs: 1.1 10*3/uL (ref 0.7–4.0)
MCH: 28.5 pg (ref 26.0–34.0)
MCHC: 33.8 g/dL (ref 30.0–36.0)
MCV: 84.2 fL (ref 80.0–100.0)
Monocytes Absolute: 0.5 10*3/uL (ref 0.1–1.0)
Monocytes Relative: 5 %
Neutro Abs: 6.7 10*3/uL (ref 1.7–7.7)
Neutrophils Relative %: 81 %
Platelets: 209 10*3/uL (ref 150–400)
RBC: 5.06 MIL/uL (ref 4.22–5.81)
RDW: 12 % (ref 11.5–15.5)
WBC: 8.3 10*3/uL (ref 4.0–10.5)
nRBC: 0 % (ref 0.0–0.2)

## 2019-11-28 LAB — COMPREHENSIVE METABOLIC PANEL
ALT: 52 U/L — ABNORMAL HIGH (ref 0–44)
AST: 24 U/L (ref 15–41)
Albumin: 3 g/dL — ABNORMAL LOW (ref 3.5–5.0)
Alkaline Phosphatase: 44 U/L (ref 38–126)
Anion gap: 9 (ref 5–15)
BUN: 25 mg/dL — ABNORMAL HIGH (ref 6–20)
CO2: 25 mmol/L (ref 22–32)
Calcium: 8.3 mg/dL — ABNORMAL LOW (ref 8.9–10.3)
Chloride: 101 mmol/L (ref 98–111)
Creatinine, Ser: 0.62 mg/dL (ref 0.61–1.24)
GFR calc Af Amer: >60 mL/min (ref >60)
GFR calc non Af Amer: >60 mL/min (ref >60)
Glucose, Bld: 301 mg/dL — ABNORMAL HIGH (ref 70–99)
Potassium: 4.5 mmol/L (ref 3.5–5.1)
Sodium: 135 mmol/L (ref 135–145)
Total Bilirubin: 0.8 mg/dL (ref 0.3–1.2)
Total Protein: 6.1 g/dL — ABNORMAL LOW (ref 6.5–8.1)

## 2019-11-28 LAB — C-REACTIVE PROTEIN: CRP: 1.5 mg/dL — ABNORMAL HIGH (ref <1.0)

## 2019-11-28 LAB — D-DIMER, QUANTITATIVE: D-Dimer, Quant: 1.28 ug/mL-FEU — ABNORMAL HIGH (ref 0.00–0.50)

## 2019-11-28 MED ORDER — KATE FARMS STANDARD 1.4 PO LIQD
325.0000 mL | Freq: Two times a day (BID) | ORAL | Status: DC
  Administered 2019-11-28 – 2019-12-01 (×6): 325 mL via ORAL
  Filled 2019-11-28 (×6): qty 325

## 2019-11-28 MED ORDER — INSULIN ASPART 100 UNIT/ML ~~LOC~~ SOLN
12.0000 [IU] | Freq: Three times a day (TID) | SUBCUTANEOUS | Status: DC
  Administered 2019-11-28: 12 [IU] via SUBCUTANEOUS

## 2019-11-28 MED ORDER — INSULIN DETEMIR 100 UNIT/ML ~~LOC~~ SOLN
25.0000 [IU] | Freq: Two times a day (BID) | SUBCUTANEOUS | Status: DC
  Administered 2019-11-28 (×2): 25 [IU] via SUBCUTANEOUS
  Filled 2019-11-28 (×3): qty 0.25

## 2019-11-28 MED ORDER — ADULT MULTIVITAMIN W/MINERALS CH
1.0000 | ORAL_TABLET | Freq: Every day | ORAL | Status: DC
  Administered 2019-11-28 – 2019-12-01 (×4): 1 via ORAL
  Filled 2019-11-28 (×4): qty 1

## 2019-11-28 MED ORDER — PROSOURCE PLUS PO LIQD
30.0000 mL | Freq: Two times a day (BID) | ORAL | Status: DC
  Administered 2019-11-28 – 2019-12-01 (×6): 30 mL via ORAL
  Filled 2019-11-28 (×7): qty 30

## 2019-11-28 MED ORDER — METHYLPREDNISOLONE SODIUM SUCC 40 MG IJ SOLR
40.0000 mg | Freq: Two times a day (BID) | INTRAMUSCULAR | Status: DC
  Administered 2019-11-28 – 2019-11-30 (×4): 40 mg via INTRAVENOUS
  Filled 2019-11-28 (×4): qty 1

## 2019-11-28 NOTE — Progress Notes (Addendum)
PROGRESS NOTE  Eric Kirby  YIR:[id number] DOB: January 30, 1981 DOA: 11/25/2019 PCP: Marlyn Corporal, PA  Brief Narrative: Eric Kirby is a 39 y.o. male with a history of T2DM, protein S deficiency, DVT on xarelto who presented with shortness of breath having tested positive for SARS-CoV-2.   In the ED, he was febrile up to 100.33F, tachycardic up to 120s, tachypneic and was hypoxic down to 85% on room air and placed on 4 L of O2 via nasal cannula. CRP 6.1. PCT <0.10. Chest x-ray showed mild left basilar infiltrate. Hypoxemia worsened prompting administration of tocilizumab and admission to SDU on 10L O2. Hypoxemia has since stabilized, though repeat CXR demonstrates advancing infiltrates.  Assessment & Plan: Principal Problem:   Pneumonia due to COVID-19 virus Active Problems:   Acute respiratory failure due to COVID-19 (HCC)   Sepsis (HCC)   Protein S deficiency (HCC)   Type 2 diabetes mellitus with hyperlipidemia (HCC)  Acute hypoxemic respiratory failure and sepsis (POA w/fever, tachycardia, tachypnea) due to covid-19 pneumonia: SARS-CoV-2 PCR positive confirmed 8/3.  - CXR this AM shows persistence of left basilar hazy opacity and increased infiltrates in right upper and left midlung on my personal review. - Continue supplemental oxygen to maintain SpO2 >85% at rest with permissive hypoxemia in the absence of respiratory distress with exertion. - Continue remdesivir x5 days - s/p tocilizumab 8/5 - Steroids x10 days, with severe hyperglycemia, sustained diminution of CRP, and use of tocilizumab, will decrease steroid dosing to 40mg  IV q12h.  - Encourage OOB, IS, FV, and awake proning if able. D/w RN and pt. - Continue airborne, contact precautions for 21 days from positive testing.  T2DM with steroid-induced hyperglycemia: HbA1c 7.6%. - Continue to be severely hyperglycemic despite quite aggressive augmentation of insulins. Will decrease steroids as above, increase levemir to 25u BID,  increase mealtime insulin to 12u TIDWC, continue resistant SSI.  - Continue new linagliptin  Protein S deficiency, history of DVT:  - Continue xarelto without interruption  DVT prophylaxis: Xarelto Code Status: Full Family Communication: None at bedside Disposition Plan:  Status is: Inpatient  Remains inpatient appropriate because:Inpatient level of care appropriate due to severity of illness   Dispo: The patient is from: Home              Anticipated d/c is to: Home              Anticipated d/c date is: 3 days depending on ability to wean from oxygen.              Patient currently is not medically stable to d/c.  Consultants:   None  Procedures:   None  Antimicrobials:  Remdesivir   Subjective: No major changes, having trouble proning, but getting up ok. No chest pain leg swelling or orthopnea. Eating well. Shortness of breath is moderate, worse with exertion.   Objective: Vitals:   11/28/19 1000 11/28/19 1100 11/28/19 1200 11/28/19 1300  BP: (!) 128/98 (!) 132/95 137/87 (!) 143/81  Pulse: (!) 101 85 89 100  Resp: (!) 30 (!) 21 20 (!) 25  Temp:      TempSrc:      SpO2: 95% (!) 86% (!) 89% (!) 82%  Weight:      Height:        Intake/Output Summary (Last 24 hours) at 11/28/2019 1511 Last data filed at 11/28/2019 1200 Gross per 24 hour  Intake --  Output 1650 ml  Net -1650 ml   01/28/2020  11/25/19 1806  Weight: 90.8 kg   Gen: 39 y.o. male in no distress Pulm: Tachypneic with bilateral crackles.  CV: Regular rate and rhythm. No murmur, rub, or gallop. No JVD, no dependent edema. GI: Abdomen soft, non-tender, non-distended, with normoactive bowel sounds.  Ext: Warm, no deformities Skin: No rashes, lesions or ulcers on visualized skin. Neuro: Alert and oriented. No focal neurological deficits. Psych: Judgement and insight appear fair. Mood euthymic & affect congruent. Behavior is appropriate.    Data Reviewed: I have personally reviewed following labs  and imaging studies  CBC: Recent Labs  Lab 11/25/19 1848 11/26/19 0439 11/27/19 0549 11/28/19 0240  WBC 6.4 4.4 7.2 8.3  NEUTROABS 5.4 3.5 5.8 6.7  HGB 15.5 14.9 15.3 14.4  HCT 45.4 45.2 45.9 42.6  MCV 84.5 85.4 84.4 84.2  PLT 152 150 194 209   Basic Metabolic Panel: Recent Labs  Lab 11/25/19 1848 11/26/19 0439 11/27/19 0549 11/28/19 0240  NA 133* 138 135 135  K 4.3 5.1 4.7 4.5  CL 97* 101 101 101  CO2 25 26 22 25   GLUCOSE 235* 272* 341* 301*  BUN 13 17 24* 25*  CREATININE 0.71 0.85 0.74 0.62  CALCIUM 8.4* 8.8* 8.9 8.3*   GFR: Estimated Creatinine Clearance: 140.1 mL/min (by C-G formula based on SCr of 0.62 mg/dL). Liver Function Tests: Recent Labs  Lab 11/25/19 1848 11/26/19 0439 11/27/19 0549 11/28/19 0240  AST 38 30 25 24   ALT 65* 61* 55* 52*  ALKPHOS 48 50 49 44  BILITOT 1.0 0.9 0.8 0.8  PROT 7.0 7.1 6.9 6.1*  ALBUMIN 3.5 3.6 3.5 3.0*   No results for input(s): LIPASE, AMYLASE in the last 168 hours. No results for input(s): AMMONIA in the last 168 hours. Coagulation Profile: No results for input(s): INR, PROTIME in the last 168 hours. Cardiac Enzymes: No results for input(s): CKTOTAL, CKMB, CKMBINDEX, TROPONINI in the last 168 hours. BNP (last 3 results) No results for input(s): PROBNP in the last 8760 hours. HbA1C: Recent Labs    11/25/19 2159  HGBA1C 7.6*   CBG: Recent Labs  Lab 11/27/19 1154 11/27/19 1725 11/27/19 2058 11/28/19 0836 11/28/19 1237  GLUCAP 400* 280* 282* 298* 336*   Lipid Profile: Recent Labs    11/25/19 1848  TRIG 87   Thyroid Function Tests: No results for input(s): TSH, T4TOTAL, FREET4, T3FREE, THYROIDAB in the last 72 hours. Anemia Panel: Recent Labs    11/25/19 1848  FERRITIN 766*   Urine analysis: No results found for: COLORURINE, APPEARANCEUR, LABSPEC, PHURINE, GLUCOSEU, HGBUR, BILIRUBINUR, KETONESUR, PROTEINUR, UROBILINOGEN, NITRITE, LEUKOCYTESUR Recent Results (from the past 240 hour(s))  Blood  Culture (routine x 2)     Status: None (Preliminary result)   Collection Time: 11/25/19  6:48 PM   Specimen: BLOOD  Result Value Ref Range Status   Specimen Description   Final    BLOOD LEFT ANTECUBITAL Performed at [facility], [address], [place], [place]    Special Requests   Final    BOTTLES DRAWN AEROBIC AND ANAEROBIC Blood Culture adequate volume Performed at [facility], [address], [place], [place]    Culture   Final    NO GROWTH 3 DAYS Performed at [facility], [address], [place], [address] [place]    Report Status PENDING  Incomplete  Blood Culture (routine x 2)     Status: None (Preliminary result)   Collection Time: 11/25/19  8:00 PM  Specimen: BLOOD  Result Value Ref Range Status   Specimen Description   Final    BLOOD BLOOD LEFT FOREARM Performed at [facility], [address] [place], [place] [address]    Special Requests   Final    BOTTLES DRAWN AEROBIC AND ANAEROBIC Blood Culture adequate volume Performed at [facility], [address] [place], [place] [address]    Culture   Final    NO GROWTH 2 DAYS Performed at [facility], [address], [place], [place] [address]    Report Status PENDING  Incomplete  SARS Coronavirus 2 by RT PCR (hospital order, performed in [facility] hospital lab) Nasopharyngeal Nasopharyngeal Swab     Status: Abnormal   Collection Time: 11/25/19  9:30 PM   Specimen: Nasopharyngeal Swab  Result Value Ref Range Status   SARS Coronavirus 2 POSITIVE (A) NEGATIVE Final    Comment: RESULT CALLED TO, READ BACK BY AND VERIFIED WITH: RN H LACIVITA AT 008 11/26/19 CRUICKSHANK A (NOTE) SARS-CoV-2 target nucleic acids are DETECTED  SARS-CoV-2 RNA is generally detectable in upper respiratory specimens  during the acute phase of infection.  Positive results are indicative  of the presence of the identified virus, but  do not rule out bacterial infection or co-infection with other pathogens not detected by the test.  Clinical correlation with patient history and  other diagnostic information is necessary to determine patient infection status.  The expected result is negative.  Fact Sheet for Patients:   BoilerBrush.com.cy   Fact Sheet for Healthcare Providers:   https://pope.com/    This test is not yet approved or cleared by the [place] FDA and  has been authorized for detection and/or diagnosis of SARS-CoV-2 by FDA under an Emergency Use Authorization (EUA).  This EUA will remain in effect (meaning  this test can be used) for the duration of  the COVID-19 declaration under Section 564(b)(1) of the Act, 21 U.S.C. section 360-bbb-3(b)(1), unless the authorization is terminated or revoked sooner.  Performed at [facility], [address], [place], [place] [address]   MRSA PCR Screening     Status: None   Collection Time: 11/27/19  2:52 PM   Specimen: Nasopharyngeal  Result Value Ref Range Status   MRSA by PCR NEGATIVE NEGATIVE Final    Comment:        The GeneXpert MRSA Assay (FDA approved for NASAL specimens only), is one component of a comprehensive MRSA colonization surveillance program. It is not intended to diagnose MRSA infection nor to guide or monitor treatment for MRSA infections. Performed at [facility], [address] [place], [place] [address]       Radiology Studies: DG CHEST PORT 1 VIEW  Result Date: 11/28/2019 CLINICAL DATA:  Shortness of breath EXAM: PORTABLE CHEST 1 VIEW COMPARISON:  November 25, 2019 FINDINGS: There is persistent ill-defined opacity in the left base region. Subtle ill-defined increased opacity in the right upper lobe is also present. There is mild interstitial prominence in the left perihilar region with somewhat equivocal airspace opacity in this area. Heart size  and pulmonary vascularity are normal. No adenopathy. No bone lesions IMPRESSION: Ill-defined airspace opacity in the left base and right upper lobe and more subtly in the left perihilar region. Appearance indicative of a degree of multifocal pneumonia. Suspect atypical organism pneumonia as most likely. Heart size and pulmonary vascular normal. No adenopathy. No bone lesions. Electronically Signed   By: Chrissie Noa  Margarita GrizzleWoodruff III M.D.   On: 11/28/2019 08:22    Scheduled Meds: . Chlorhexidine Gluconate Cloth  6 each Topical Daily  . insulin aspart  0-20 Units Subcutaneous TID WC  . insulin aspart  0-5 Units Subcutaneous QHS  . insulin aspart  8 Units Subcutaneous TID WC  . insulin detemir  25 Units Subcutaneous BID  . linagliptin  5 mg Oral Daily  . mouth rinse  15 mL Mouth Rinse BID  . methylPREDNISolone (SOLU-MEDROL) injection  60 mg Intravenous Q12H  . pantoprazole  40 mg Oral Daily  . rivaroxaban  20 mg Oral Q breakfast  . rosuvastatin  20 mg Oral QHS   Continuous Infusions: . remdesivir 100 mg in NS 100 mL Stopped (11/28/19 1137)     LOS: 3 days   Time spent: 35 minutes.  Tyrone Nineyan B Kendall Arnell, MD [facility] [email or web address] 11/28/2019, 3:11 PM

## 2019-11-28 NOTE — Progress Notes (Signed)
Initial Nutrition Assessment  DOCUMENTATION CODES:   Not applicable  INTERVENTION:  - will order [employer] 1.4 po BID, each supplement provides 455 kcal and 20 grams protein. - will order 30 ml Prosource Plus BID, each supplement provides 100 kcal and 15 grams protein. - will order 1 tablet multivitamin with minerals. - will complete NFPE when possible.   NUTRITION DIAGNOSIS:   Increased nutrient needs related to acute illness, catabolic illness (COVID-19 infection) as evidenced by estimated needs.  GOAL:   Patient will meet greater than or equal to 90% of their needs  MONITOR:   PO intake, Supplement acceptance, Labs, Weight trends  REASON FOR ASSESSMENT:   Malnutrition Screening Tool  ASSESSMENT:   39 y.o. male with a history of type 2 DM, protein S deficiency, DVT on Xarelto. He presented to the ED with shortness of breath and tested positive for COVID-19.  No intakes documented since admission. RN not available at this time and tech in patient's room helping him with bathing.   MST report shows that patient reported losing 6 lb in the past 1 week. Weight on 8/3 documented as 200 lb. No other weight information available in the chart.   Per notes: -  Acute hypoxemic respiratory failure - sepsis - COVID-19 positive - hx of type 2 DM with steroid-induced hyperglycemia   Labs reviewed; HgbA1c: 7.6%, CBGs: 298 and 336 mg/dl, BUN: 25 mg/dl, Ca: 8.3 mg/dl, ALT elevated. Medications reviewed; sliding scale novolog, 8 units nvolog TID, 25 units levemir BID, 5 mg tradjuenta/day, 40 mg solu-medrol BID, 100 mg IV remdesivir x1 dose/day (8/4-8/7).    NUTRITION - FOCUSED PHYSICAL EXAM:  unable to complete at this time.   Diet Order:   Diet Order            Diet Carb Modified Fluid consistency: Thin; Room service appropriate? Yes  Diet effective now                 EDUCATION NEEDS:   No education needs have been identified at this time  Skin:  Skin Assessment:  Reviewed RN Assessment  Last BM:  8/6  Height:   Ht Readings from Last 1 Encounters:  11/25/19 6' (1.829 m)    Weight:   Wt Readings from Last 1 Encounters:  11/25/19 90.8 kg    Estimated Nutritional Needs:  Kcal:  2200-2475 kcal Protein:  110-125 grams Fluid:  >/= 2.3 L/day     Trenton Gammon, MS, RD, LDN, CNSC Inpatient Clinical Dietitian RD pager # available in AMION  After hours/weekend pager # available in [facility]

## 2019-11-28 NOTE — TOC Initial Note (Signed)
Transition of Care [facility]) - Initial/Assessment Note    Patient Details  Name: Eric Kirby MRN: [id number] Date of Birth: [birth year]  Transition of [facility]) CM/SW Contact:    Golda Acre, RN Phone Number: 11/28/2019, 8:16 AM  Clinical Narrative:                 covid positive unvaccinated male, o2 at 8l/min, iv soul medrol and iv remdesivir through [id number]. Following for progression and toc needs.  Expected Discharge Plan: Home/Self Care Barriers to Discharge: Continued Medical Work up   Patient Goals and CMS Choice Patient states their goals for this hospitalization and ongoing recovery are:: to get over this CMS Medicare.gov Compare Post Acute Care list provided to:: Patient    Expected Discharge Plan and Services Expected Discharge Plan: Home/Self Care   Discharge Planning Services: CM Consult   Living arrangements for the past 2 months: Single Family Home                                      Prior Living Arrangements/Services Living arrangements for the past 2 months: Single Family Home Lives with:: Self Patient language and need for interpreter reviewed:: Yes Do you feel safe going back to the place where you live?: Yes      Need for Family Participation in Patient Care: Yes (Comment) Care giver support system in place?: Yes (comment)   Criminal Activity/Legal Involvement Pertinent to Current Situation/Hospitalization: No - Comment as needed  Activities of Daily Living Home Assistive Devices/Equipment: Eyeglasses ADL Screening (condition at time of admission) Patient's cognitive ability adequate to safely complete daily activities?: Yes Is the patient deaf or have difficulty hearing?: No Does the patient have difficulty seeing, even when wearing glasses/contacts?: No Does the patient have difficulty concentrating, remembering, or making decisions?: No Patient able to express need for assistance with ADLs?: Yes Does the patient have  difficulty dressing or bathing?: No Independently performs ADLs?: Yes (appropriate for developmental age) Does the patient have difficulty walking or climbing stairs?: Yes Weakness of Legs: Both Weakness of Arms/Hands: Both  Permission Sought/Granted                  Emotional Assessment Appearance:: Appears stated age Attitude/Demeanor/Rapport: Engaged Affect (typically observed): Calm Orientation: : Oriented to Self, Oriented to Place, Oriented to  Time, Oriented to Situation Alcohol / Substance Use: Not Applicable Psych Involvement: No (comment)  Admission diagnosis:  Acute respiratory failure with hypoxia (HCC) [J96.01] Pneumonia due to COVID-19 virus [U07.1, J12.82] COVID-19 [U07.1] Patient Active Problem List   Diagnosis Date Noted  . Pneumonia due to COVID-19 virus 11/25/2019  . Acute respiratory failure due to COVID-19 (HCC) 11/25/2019  . Sepsis (HCC) 11/25/2019  . Protein S deficiency (HCC) 11/25/2019  . Type 2 diabetes mellitus with hyperlipidemia (HCC) 11/25/2019   PCP:  Marlyn Corporal, PA Pharmacy:   [facility] [address] [place] - [address] [facility] [place] [address] Phone: [phone] Fax: [phone]     Social Determinants of Health (SDOH) Interventions    Readmission Risk Interventions No flowsheet data found.

## 2019-11-29 LAB — COMPREHENSIVE METABOLIC PANEL
ALT: 60 U/L — ABNORMAL HIGH (ref 0–44)
AST: 24 U/L (ref 15–41)
Albumin: 3.1 g/dL — ABNORMAL LOW (ref 3.5–5.0)
Alkaline Phosphatase: 48 U/L (ref 38–126)
Anion gap: 9 (ref 5–15)
BUN: 26 mg/dL — ABNORMAL HIGH (ref 6–20)
CO2: 26 mmol/L (ref 22–32)
Calcium: 8.4 mg/dL — ABNORMAL LOW (ref 8.9–10.3)
Chloride: 100 mmol/L (ref 98–111)
Creatinine, Ser: 0.65 mg/dL (ref 0.61–1.24)
GFR calc Af Amer: >60 mL/min (ref >60)
GFR calc non Af Amer: >60 mL/min (ref >60)
Glucose, Bld: 313 mg/dL — ABNORMAL HIGH (ref 70–99)
Potassium: 5.2 mmol/L — ABNORMAL HIGH (ref 3.5–5.1)
Sodium: 135 mmol/L (ref 135–145)
Total Bilirubin: 0.5 mg/dL (ref 0.3–1.2)
Total Protein: 6.4 g/dL — ABNORMAL LOW (ref 6.5–8.1)

## 2019-11-29 LAB — GLUCOSE, CAPILLARY
Glucose-Capillary: 280 mg/dL — ABNORMAL HIGH (ref 70–99)
Glucose-Capillary: 334 mg/dL — ABNORMAL HIGH (ref 70–99)
Glucose-Capillary: 330 mg/dL — ABNORMAL HIGH (ref 70–99)
Glucose-Capillary: 298 mg/dL — ABNORMAL HIGH (ref 70–99)

## 2019-11-29 LAB — CBC WITH DIFFERENTIAL/PLATELET
Abs Immature Granulocytes: 0.2 10*3/uL — ABNORMAL HIGH (ref 0.00–0.07)
Basophils Absolute: 0.1 10*3/uL (ref 0.0–0.1)
Basophils Relative: 1 %
Eosinophils Absolute: 0 10*3/uL (ref 0.0–0.5)
Eosinophils Relative: 0 %
HCT: 43.7 % (ref 39.0–52.0)
Hemoglobin: 14.4 g/dL (ref 13.0–17.0)
Immature Granulocytes: 2 %
Lymphocytes Relative: 13 %
Lymphs Abs: 1.3 10*3/uL (ref 0.7–4.0)
MCH: 28 pg (ref 26.0–34.0)
MCHC: 33 g/dL (ref 30.0–36.0)
MCV: 84.9 fL (ref 80.0–100.0)
Monocytes Absolute: 0.7 10*3/uL (ref 0.1–1.0)
Monocytes Relative: 6 %
Neutro Abs: 8 10*3/uL — ABNORMAL HIGH (ref 1.7–7.7)
Neutrophils Relative %: 78 %
Platelets: 244 10*3/uL (ref 150–400)
RBC: 5.15 MIL/uL (ref 4.22–5.81)
RDW: 11.9 % (ref 11.5–15.5)
WBC: 10.2 10*3/uL (ref 4.0–10.5)
nRBC: 0 % (ref 0.0–0.2)

## 2019-11-29 LAB — D-DIMER, QUANTITATIVE: D-Dimer, Quant: 1.48 ug/mL-FEU — ABNORMAL HIGH (ref 0.00–0.50)

## 2019-11-29 LAB — C-REACTIVE PROTEIN: CRP: 0.9 mg/dL (ref <1.0)

## 2019-11-29 MED ORDER — INSULIN ASPART 100 UNIT/ML ~~LOC~~ SOLN
15.0000 [IU] | Freq: Three times a day (TID) | SUBCUTANEOUS | Status: DC
  Administered 2019-11-29 – 2019-12-01 (×8): 15 [IU] via SUBCUTANEOUS

## 2019-11-29 MED ORDER — INSULIN DETEMIR 100 UNIT/ML ~~LOC~~ SOLN
40.0000 [IU] | Freq: Two times a day (BID) | SUBCUTANEOUS | Status: DC
  Administered 2019-11-29 – 2019-12-01 (×5): 40 [IU] via SUBCUTANEOUS
  Filled 2019-11-29 (×5): qty 0.4

## 2019-11-29 MED ORDER — SODIUM ZIRCONIUM CYCLOSILICATE 5 G PO PACK
5.0000 g | PACK | Freq: Once | ORAL | Status: AC
  Administered 2019-11-29: 5 g via ORAL
  Filled 2019-11-29: qty 1

## 2019-11-29 NOTE — Progress Notes (Signed)
PROGRESS NOTE  Eric Kirby  HOZ:[id number] DOB: 1981-03-28 DOA: 11/25/2019 PCP: Marlyn Corporal, PA  Brief Narrative: Eric Kirby is a 39 y.o. male with a history of T2DM, protein S deficiency, DVT on xarelto who presented with shortness of breath having tested positive for SARS-CoV-2.   In the ED, he was febrile up to 100.22F, tachycardic up to 120s, tachypneic and was hypoxic down to 85% on room air and placed on 4 L of O2 via nasal cannula. CRP 6.1. PCT <0.10. Chest x-ray showed mild left basilar infiltrate. Hypoxemia worsened prompting administration of tocilizumab and admission to SDU on 10L O2. Hypoxemia has since stabilized, though repeat CXR demonstrated advancing infiltrates. Completed remdesivir 8/7. Steroid dose decreased and insulin dosing has been aggressively increased due to severe hyperglycemia.  Assessment & Plan: Principal Problem:   Pneumonia due to COVID-19 virus Active Problems:   Acute respiratory failure due to COVID-19 (HCC)   Sepsis (HCC)   Protein S deficiency (HCC)   Type 2 diabetes mellitus with hyperlipidemia (HCC)  Acute hypoxemic respiratory failure and sepsis (POA w/fever, tachycardia, tachypnea) due to covid-19 pneumonia: SARS-CoV-2 PCR positive confirmed 8/3.  - Continue supplemental oxygen with permissive hypoxemia in the absence of respiratory distress with exertion. - Completed remdesivir x5 days - s/p tocilizumab 8/5 - Steroids x10 days, decreased solumedrol to 40mg  IV q12h. If hyperglycemia continues to be difficult to control, would consider further deescalation of steroids with normalization of CRP. - Encourage OOB, IS, FV, and awake proning if able. D/w RN and pt. - Continue airborne, contact precautions for 21 days from positive testing.  T2DM with steroid-induced hyperglycemia: HbA1c 7.6%. - Severely hyperglycemic despite quite aggressive augmentation of insulin. Insulin naive, but received120 units of insulin yesterday and lowest CBG was  280mg /dl. Increase levemir further to 40u BID, increase mealtime novolog to 15u TIDWC + resistant SSI and HS coverage.  - Continue new linagliptin - Carb-modified diet  Protein S deficiency, history of DVT:  - Continue xarelto full dose   DVT prophylaxis: Xarelto Code Status: Full Family Communication: None at bedside Disposition Plan:  Status is: Inpatient  Remains inpatient appropriate because:Inpatient level of care appropriate due to severity of illness   Dispo: The patient is from: Home              Anticipated d/c is to: Home              Anticipated d/c date is: ~3 days depending on ability to wean from oxygen.              Patient currently is not medically stable to d/c.  Consultants:   None  Procedures:   None  Antimicrobials:  Remdesivir 8/3 - 8/7.  Subjective: Tolerating proning somewhat. No chest discomfort. Cough is stable, intermittent, and associated with worsening shortness of breath.   Objective: Vitals:   11/29/19 1000 11/29/19 1100 11/29/19 1157 11/29/19 1200  BP: 119/60   (!) 147/83  Pulse: 71 92  91  Resp: (!) 27 (!) 22    Temp:   97.9 F (36.6 C)   TempSrc:   Oral   SpO2: 90% 92%  (!) 89%  Weight:      Height:        Intake/Output Summary (Last 24 hours) at 11/29/2019 1342 Last data filed at 11/29/2019 0900 Gross per 24 hour  Intake 360 ml  Output 2200 ml  Net -1840 ml   Filed Weights   11/25/19 1806 11/28/19 2000  Weight:  90.8 kg 88.9 kg   Gen: 39 y.o. male in no distress Pulm: Tachypneic with bilateral crackles.  CV: Regular rate and rhythm. No murmur, rub, or gallop. No JVD, no dependent edema. GI: Abdomen soft, non-tender, non-distended, with normoactive bowel sounds.  Ext: Warm, no deformities Skin: No rashes, lesions or ulcers on visualized skin. Neuro: Alert and oriented. No focal neurological deficits. Psych: Judgement and insight appear fair. Mood euthymic & affect congruent. Behavior is appropriate.    Data Reviewed: I  have personally reviewed following labs and imaging studies  CBC: Recent Labs  Lab 11/25/19 1848 11/26/19 0439 11/27/19 0549 11/28/19 0240 11/29/19 0243  WBC 6.4 4.4 7.2 8.3 10.2  NEUTROABS 5.4 3.5 5.8 6.7 8.0*  HGB 15.5 14.9 15.3 14.4 14.4  HCT 45.4 45.2 45.9 42.6 43.7  MCV 84.5 85.4 84.4 84.2 84.9  PLT 152 150 194 209 244   Basic Metabolic Panel: Recent Labs  Lab 11/25/19 1848 11/26/19 0439 11/27/19 0549 11/28/19 0240 11/29/19 0243  NA 133* 138 135 135 135  K 4.3 5.1 4.7 4.5 5.2*  CL 97* 101 101 101 100  CO2 25 26 22 25 26   GLUCOSE 235* 272* 341* 301* 313*  BUN 13 17 24* 25* 26*  CREATININE 0.71 0.85 0.74 0.62 0.65  CALCIUM 8.4* 8.8* 8.9 8.3* 8.4*   GFR: Estimated Creatinine Clearance: 140.1 mL/min (by C-G formula based on SCr of 0.65 mg/dL). Liver Function Tests: Recent Labs  Lab 11/25/19 1848 11/26/19 0439 11/27/19 0549 11/28/19 0240 11/29/19 0243  AST 38 30 25 24 24   ALT 65* 61* 55* 52* 60*  ALKPHOS 48 50 49 44 48  BILITOT 1.0 0.9 0.8 0.8 0.5  PROT 7.0 7.1 6.9 6.1* 6.4*  ALBUMIN 3.5 3.6 3.5 3.0* 3.1*   CBG: Recent Labs  Lab 11/28/19 1237 11/28/19 1708 11/28/19 2145 11/29/19 0758 11/29/19 1156  GLUCAP 336* 291* 386* 280* 334*   Recent Results (from the past 240 hour(s))  Blood Culture (routine x 2)     Status: None (Preliminary result)   Collection Time: 11/25/19  6:48 PM   Specimen: BLOOD  Result Value Ref Range Status   Specimen Description   Final    BLOOD LEFT ANTECUBITAL Performed at [facility], [address] [place], [place]    Special Requests   Final    BOTTLES DRAWN AEROBIC AND ANAEROBIC Blood Culture adequate volume Performed at [facility], [address], [place], [place]    Culture   Final    NO GROWTH 4 DAYS Performed at [facility], [address] [place], [address] [place]    Report Status PENDING  Incomplete  Blood Culture (routine x 2)     Status:  None (Preliminary result)   Collection Time: 11/25/19  8:00 PM   Specimen: BLOOD  Result Value Ref Range Status   Specimen Description   Final    BLOOD BLOOD LEFT FOREARM Performed at [facility], [address], [place], [place]    Special Requests   Final    BOTTLES DRAWN AEROBIC AND ANAEROBIC Blood Culture adequate volume Performed at [facility], [address] [place], [place]    Culture   Final    NO GROWTH 3 DAYS Performed at [facility], [address], [place], [address] [place]    Report Status PENDING  Incomplete  SARS Coronavirus 2 by RT PCR (hospital order, performed  in [facility] hospital lab) Nasopharyngeal Nasopharyngeal Swab     Status: Abnormal   Collection Time: 11/25/19  9:30 PM   Specimen: Nasopharyngeal Swab  Result Value Ref Range Status   SARS Coronavirus 2 POSITIVE (A) NEGATIVE Final    Comment: RESULT CALLED TO, READ BACK BY AND VERIFIED WITH: RN H LACIVITA AT 008 11/26/19 CRUICKSHANK A (NOTE) SARS-CoV-2 target nucleic acids are DETECTED  SARS-CoV-2 RNA is generally detectable in upper respiratory specimens  during the acute phase of infection.  Positive results are indicative  of the presence of the identified virus, but do not rule out bacterial infection or co-infection with other pathogens not detected by the test.  Clinical correlation with patient history and  other diagnostic information is necessary to determine patient infection status.  The expected result is negative.  Fact Sheet for Patients:   BoilerBrush.com.cy   Fact Sheet for Healthcare Providers:   https://pope.com/    This test is not yet approved or cleared by the [place] FDA and  has been authorized for detection and/or diagnosis of SARS-CoV-2 by FDA under an Emergency Use Authorization (EUA).  This EUA will remain in effect (meaning  this test can be used)  for the duration of  the COVID-19 declaration under Section 564(b)(1) of the Act, 21 U.S.C. section 360-bbb-3(b)(1), unless the authorization is terminated or revoked sooner.  Performed at [facility], [address], [place], [place] [address]   MRSA PCR Screening     Status: None   Collection Time: 11/27/19  2:52 PM   Specimen: Nasopharyngeal  Result Value Ref Range Status   MRSA by PCR NEGATIVE NEGATIVE Final    Comment:        The GeneXpert MRSA Assay (FDA approved for NASAL specimens only), is one component of a comprehensive MRSA colonization surveillance program. It is not intended to diagnose MRSA infection nor to guide or monitor treatment for MRSA infections. Performed at [facility], [address] [place], [place] [address]       Radiology Studies: DG CHEST PORT 1 VIEW  Result Date: 11/28/2019 CLINICAL DATA:  Shortness of breath EXAM: PORTABLE CHEST 1 VIEW COMPARISON:  November 25, 2019 FINDINGS: There is persistent ill-defined opacity in the left base region. Subtle ill-defined increased opacity in the right upper lobe is also present. There is mild interstitial prominence in the left perihilar region with somewhat equivocal airspace opacity in this area. Heart size and pulmonary vascularity are normal. No adenopathy. No bone lesions IMPRESSION: Ill-defined airspace opacity in the left base and right upper lobe and more subtly in the left perihilar region. Appearance indicative of a degree of multifocal pneumonia. Suspect atypical organism pneumonia as most likely. Heart size and pulmonary vascular normal. No adenopathy. No bone lesions. Electronically Signed   By: Bretta Bang III M.D.   On: 11/28/2019 08:22    Scheduled Meds: . (feeding supplement) PROSource Plus  30 mL Oral BID BM  . Chlorhexidine Gluconate Cloth  6 each Topical Daily  . feeding supplement (KATE FARMS STANDARD 1.4)  325 mL Oral BID BM  . insulin aspart   0-20 Units Subcutaneous TID WC  . insulin aspart  0-5 Units Subcutaneous QHS  . insulin aspart  15 Units Subcutaneous TID WC  . insulin detemir  40 Units Subcutaneous BID  . linagliptin  5 mg Oral Daily  . mouth rinse  15 mL Mouth Rinse BID  . methylPREDNISolone (SOLU-MEDROL) injection  40 mg Intravenous  Q12H  . multivitamin with minerals  1 tablet Oral Daily  . pantoprazole  40 mg Oral Daily  . rivaroxaban  20 mg Oral Q breakfast  . rosuvastatin  20 mg Oral QHS   Continuous Infusions:    LOS: 4 days   Time spent: 35 minutes.  Tyrone Nineyan B Halen Antenucci, MD [facility] [email or web address] 11/29/2019, 1:42 PM

## 2019-11-30 LAB — GLUCOSE, CAPILLARY
Glucose-Capillary: 198 mg/dL — ABNORMAL HIGH (ref 70–99)
Glucose-Capillary: 398 mg/dL — ABNORMAL HIGH (ref 70–99)
Glucose-Capillary: 292 mg/dL — ABNORMAL HIGH (ref 70–99)
Glucose-Capillary: 174 mg/dL — ABNORMAL HIGH (ref 70–99)

## 2019-11-30 LAB — CULTURE, BLOOD (ROUTINE X 2)
Culture: NO GROWTH
Special Requests: ADEQUATE

## 2019-11-30 LAB — COMPREHENSIVE METABOLIC PANEL
ALT: 69 U/L — ABNORMAL HIGH (ref 0–44)
AST: 29 U/L (ref 15–41)
Albumin: 3.4 g/dL — ABNORMAL LOW (ref 3.5–5.0)
Alkaline Phosphatase: 54 U/L (ref 38–126)
Anion gap: 9 (ref 5–15)
BUN: 31 mg/dL — ABNORMAL HIGH (ref 6–20)
CO2: 28 mmol/L (ref 22–32)
Calcium: 8.7 mg/dL — ABNORMAL LOW (ref 8.9–10.3)
Chloride: 101 mmol/L (ref 98–111)
Creatinine, Ser: 0.58 mg/dL — ABNORMAL LOW (ref 0.61–1.24)
GFR calc Af Amer: >60 mL/min (ref >60)
GFR calc non Af Amer: >60 mL/min (ref >60)
Glucose, Bld: 213 mg/dL — ABNORMAL HIGH (ref 70–99)
Potassium: 4.8 mmol/L (ref 3.5–5.1)
Sodium: 138 mmol/L (ref 135–145)
Total Bilirubin: 0.8 mg/dL (ref 0.3–1.2)
Total Protein: 6.5 g/dL (ref 6.5–8.1)

## 2019-11-30 MED ORDER — PREDNISONE 20 MG PO TABS
20.0000 mg | ORAL_TABLET | Freq: Two times a day (BID) | ORAL | Status: DC
  Administered 2019-11-30 – 2019-12-01 (×2): 20 mg via ORAL
  Filled 2019-11-30 (×2): qty 1

## 2019-11-30 NOTE — Progress Notes (Signed)
PROGRESS NOTE  Eric Kirby  HBZ:[id number] DOB: 1980-06-21 DOA: 11/25/2019 PCP: Marlyn Corporal, PA  Brief Narrative: Eric Kirby is a 39 y.o. male with a history of T2DM, protein S deficiency, DVT on xarelto who presented with shortness of breath having tested positive for SARS-CoV-2.   In the ED, he was febrile up to 100.81F, tachycardic up to 120s, tachypneic and was hypoxic down to 85% on room air and placed on 4 L of O2 via nasal cannula. CRP 6.1. PCT <0.10. Chest x-ray showed mild left basilar infiltrate. Hypoxemia worsened prompting administration of tocilizumab and admission to SDU on 10L O2. Hypoxemia has since stabilized, though repeat CXR demonstrated advancing infiltrates. Completed remdesivir 8/7. Steroid dose decreased and insulin dosing has been aggressively increased due to severe hyperglycemia.  Assessment & Plan: Principal Problem:   Pneumonia due to COVID-19 virus Active Problems:   Acute respiratory failure due to COVID-19 (HCC)   Sepsis (HCC)   Protein S deficiency (HCC)   Type 2 diabetes mellitus with hyperlipidemia (HCC)  Acute hypoxemic respiratory failure and sepsis (POA w/fever, tachycardia, tachypnea) due to covid-19 pneumonia: SARS-CoV-2 PCR positive confirmed 8/3.  - Continue supplemental oxygen with permissive hypoxemia in the absence of respiratory distress with exertion. - Completed remdesivir x5 days - s/p tocilizumab 8/5 - Steroids x10 days, decreased solumedrol to 40mg  IV q12h yesterday - continue to wean down given profound hyperglycemia - start prednisone 20 PO BID - follow along clinically - Encourage OOB, IS, FV, and awake proning if able - Continue airborne, contact precautions for 21 days from positive testing. Recent Labs    11/28/19 0240 11/29/19 0243  DDIMER 1.28* 1.48*  CRP 1.5* 0.9   Lab Results  Component Value Date   SARSCOV2NAA POSITIVE (A) 11/25/2019   T2DM with steroid-induced hyperglycemia: HbA1c 7.6%. - Severely  hyperglycemic despite quite aggressive augmentation of insulin. Insulin naive, but received120 units of insulin yesterday and lowest CBG was 280mg /dl.  - Increase levemir further to 40u BID, increase mealtime novolog to 15u TIDWC + resistant SSI and HS coverage. - Aggressively weaning steroids as above.  - Continue new linagliptin - Carb-modified diet  Protein S deficiency, history of DVT:  - Continue xarelto full dose   DVT prophylaxis: Xarelto Code Status: Full Family Communication: None available this afternoon over the phone Disposition Plan:  Status is: Inpatient  Remains inpatient appropriate because:Inpatient level of care appropriate due to severity of illness   Dispo: The patient is from: Home              Anticipated d/c is to: Home              Anticipated d/c date is: ~3 days depending on ability to wean from oxygen.              Patient currently is not medically stable to d/c.  Consultants:   None  Procedures:   None  Antimicrobials:  Remdesivir 8/3 - 8/7  Subjective: No acute issues or events overnight denies nausea, vomiting, diarrhea, constipation, headache, fevers, chills.  Objective: Vitals:   11/30/19 0400 11/30/19 0431 11/30/19 0500 11/30/19 0600  BP:  116/67    Pulse: 68 64 74 70  Resp: 20 13 (!) 22 17  Temp:      TempSrc:      SpO2: 92% 95% (!) 86% 94%  Weight:      Height:        Intake/Output Summary (Last 24 hours) at 11/30/2019 01/30/20 Last data  filed at 11/30/2019 0504 Gross per 24 hour  Intake 1080 ml  Output 3825 ml  Net -2745 ml   Filed Weights   11/25/19 1806 11/28/19 2000  Weight: 90.8 kg 88.9 kg   Gen: 39 y.o. male in no distress Pulm: Tachypneic with bilateral crackles.  CV: Regular rate and rhythm. No murmur, rub, or gallop. No JVD, no dependent edema. GI: Abdomen soft, non-tender, non-distended, with normoactive bowel sounds.  Ext: Warm, no deformities Skin: No rashes, lesions or ulcers on visualized skin. Neuro: Alert  and oriented. No focal neurological deficits. Psych: Judgement and insight appear fair. Mood euthymic & affect congruent. Behavior is appropriate.    Data Reviewed: I have personally reviewed following labs and imaging studies  CBC: Recent Labs  Lab 11/25/19 1848 11/26/19 0439 11/27/19 0549 11/28/19 0240 11/29/19 0243  WBC 6.4 4.4 7.2 8.3 10.2  NEUTROABS 5.4 3.5 5.8 6.7 8.0*  HGB 15.5 14.9 15.3 14.4 14.4  HCT 45.4 45.2 45.9 42.6 43.7  MCV 84.5 85.4 84.4 84.2 84.9  PLT 152 150 194 209 244   Basic Metabolic Panel: Recent Labs  Lab 11/26/19 0439 11/27/19 0549 11/28/19 0240 11/29/19 0243 11/30/19 0100  NA 138 135 135 135 138  K 5.1 4.7 4.5 5.2* 4.8  CL 101 101 101 100 101  CO2 26 22 25 26 28   GLUCOSE 272* 341* 301* 313* 213*  BUN 17 24* 25* 26* 31*  CREATININE 0.85 0.74 0.62 0.65 0.58*  CALCIUM 8.8* 8.9 8.3* 8.4* 8.7*   GFR: Estimated Creatinine Clearance: 140.1 mL/min (A) (by C-G formula based on SCr of 0.58 mg/dL (L)). Liver Function Tests: Recent Labs  Lab 11/26/19 0439 11/27/19 0549 11/28/19 0240 11/29/19 0243 11/30/19 0100  AST 30 25 24 24 29   ALT 61* 55* 52* 60* 69*  ALKPHOS 50 49 44 48 54  BILITOT 0.9 0.8 0.8 0.5 0.8  PROT 7.1 6.9 6.1* 6.4* 6.5  ALBUMIN 3.6 3.5 3.0* 3.1* 3.4*   CBG: Recent Labs  Lab 11/28/19 2145 11/29/19 0758 11/29/19 1156 11/29/19 1614 11/29/19 2219  GLUCAP 386* 280* 334* 330* 298*   Recent Results (from the past 240 hour(s))  Blood Culture (routine x 2)     Status: None (Preliminary result)   Collection Time: 11/25/19  6:48 PM   Specimen: BLOOD  Result Value Ref Range Status   Specimen Description   Final    BLOOD LEFT ANTECUBITAL Performed at [facility], [address], [place], [place]    Special Requests   Final    BOTTLES DRAWN AEROBIC AND ANAEROBIC Blood Culture adequate volume Performed at [facility], [address], [place], [place]    Culture    Final    NO GROWTH 4 DAYS Performed at [facility], [address], [place], [address] [place]    Report Status PENDING  Incomplete  Blood Culture (routine x 2)     Status: None (Preliminary result)   Collection Time: 11/25/19  8:00 PM   Specimen: BLOOD  Result Value Ref Range Status   Specimen Description   Final    BLOOD BLOOD LEFT FOREARM Performed at [facility], [address], [place], [place]    Special Requests   Final    BOTTLES DRAWN AEROBIC AND ANAEROBIC Blood Culture adequate volume Performed at [facility], [address] [place], [place]    Culture   Final    NO  GROWTH 3 DAYS Performed at [facility], [address], [place], [place] [address]    Report Status PENDING  Incomplete  SARS Coronavirus 2 by RT PCR (hospital order, performed in [facility] hospital lab) Nasopharyngeal Nasopharyngeal Swab     Status: Abnormal   Collection Time: 11/25/19  9:30 PM   Specimen: Nasopharyngeal Swab  Result Value Ref Range Status   SARS Coronavirus 2 POSITIVE (A) NEGATIVE Final    Comment: RESULT CALLED TO, READ BACK BY AND VERIFIED WITH: RN H LACIVITA AT 008 11/26/19 CRUICKSHANK A (NOTE) SARS-CoV-2 target nucleic acids are DETECTED  SARS-CoV-2 RNA is generally detectable in upper respiratory specimens  during the acute phase of infection.  Positive results are indicative  of the presence of the identified virus, but do not rule out bacterial infection or co-infection with other pathogens not detected by the test.  Clinical correlation with patient history and  other diagnostic information is necessary to determine patient infection status.  The expected result is negative.  Fact Sheet for Patients:   BoilerBrush.com.cy   Fact Sheet for Healthcare Providers:   https://pope.com/    This test is not yet approved or cleared by the [place] FDA and    has been authorized for detection and/or diagnosis of SARS-CoV-2 by FDA under an Emergency Use Authorization (EUA).  This EUA will remain in effect (meaning  this test can be used) for the duration of  the COVID-19 declaration under Section 564(b)(1) of the Act, 21 U.S.C. section 360-bbb-3(b)(1), unless the authorization is terminated or revoked sooner.  Performed at [facility], [address], [place], [place] [address]   MRSA PCR Screening     Status: None   Collection Time: 11/27/19  2:52 PM   Specimen: Nasopharyngeal  Result Value Ref Range Status   MRSA by PCR NEGATIVE NEGATIVE Final    Comment:        The GeneXpert MRSA Assay (FDA approved for NASAL specimens only), is one component of a comprehensive MRSA colonization surveillance program. It is not intended to diagnose MRSA infection nor to guide or monitor treatment for MRSA infections. Performed at [facility], [address], [place], [place] [address]       Radiology Studies: DG CHEST PORT 1 VIEW  Result Date: 11/28/2019 CLINICAL DATA:  Shortness of breath EXAM: PORTABLE CHEST 1 VIEW COMPARISON:  November 25, 2019 FINDINGS: There is persistent ill-defined opacity in the left base region. Subtle ill-defined increased opacity in the right upper lobe is also present. There is mild interstitial prominence in the left perihilar region with somewhat equivocal airspace opacity in this area. Heart size and pulmonary vascularity are normal. No adenopathy. No bone lesions IMPRESSION: Ill-defined airspace opacity in the left base and right upper lobe and more subtly in the left perihilar region. Appearance indicative of a degree of multifocal pneumonia. Suspect atypical organism pneumonia as most likely. Heart size and pulmonary vascular normal. No adenopathy. No bone lesions. Electronically Signed   By: Bretta Bang III M.D.   On: 11/28/2019 08:22   Scheduled Meds: . (feeding  supplement) PROSource Plus  30 mL Oral BID BM  . Chlorhexidine Gluconate Cloth  6 each Topical Daily  . feeding supplement (KATE FARMS STANDARD 1.4)  325 mL Oral BID BM  . insulin aspart  0-20 Units Subcutaneous TID WC  . insulin aspart  0-5 Units Subcutaneous QHS  . insulin aspart  15 Units Subcutaneous TID WC  .  insulin detemir  40 Units Subcutaneous BID  . linagliptin  5 mg Oral Daily  . mouth rinse  15 mL Mouth Rinse BID  . methylPREDNISolone (SOLU-MEDROL) injection  40 mg Intravenous Q12H  . multivitamin with minerals  1 tablet Oral Daily  . pantoprazole  40 mg Oral Daily  . rivaroxaban  20 mg Oral Q breakfast  . rosuvastatin  20 mg Oral QHS   Continuous Infusions:  LOS: 5 days   Time spent: 35 minutes  Azucena FallenWilliam C Tashonna Descoteaux, DO [facility] [email or web address] 11/30/2019, 7:23 AM

## 2019-12-01 LAB — COMPREHENSIVE METABOLIC PANEL
ALT: 72 U/L — ABNORMAL HIGH (ref 0–44)
AST: 25 U/L (ref 15–41)
Albumin: 3.4 g/dL — ABNORMAL LOW (ref 3.5–5.0)
Alkaline Phosphatase: 53 U/L (ref 38–126)
Anion gap: 7 (ref 5–15)
BUN: 26 mg/dL — ABNORMAL HIGH (ref 6–20)
CO2: 28 mmol/L (ref 22–32)
Calcium: 8.2 mg/dL — ABNORMAL LOW (ref 8.9–10.3)
Chloride: 95 mmol/L — ABNORMAL LOW (ref 98–111)
Creatinine, Ser: 0.71 mg/dL (ref 0.61–1.24)
GFR calc Af Amer: >60 mL/min (ref >60)
GFR calc non Af Amer: >60 mL/min (ref >60)
Glucose, Bld: 290 mg/dL — ABNORMAL HIGH (ref 70–99)
Potassium: 5 mmol/L (ref 3.5–5.1)
Sodium: 130 mmol/L — ABNORMAL LOW (ref 135–145)
Total Bilirubin: 0.5 mg/dL (ref 0.3–1.2)
Total Protein: 6.3 g/dL — ABNORMAL LOW (ref 6.5–8.1)

## 2019-12-01 LAB — CBC
HCT: 46.7 % (ref 39.0–52.0)
Hemoglobin: 15.3 g/dL (ref 13.0–17.0)
MCH: 27.9 pg (ref 26.0–34.0)
MCHC: 32.8 g/dL (ref 30.0–36.0)
MCV: 85.2 fL (ref 80.0–100.0)
Platelets: 235 10*3/uL (ref 150–400)
RBC: 5.48 MIL/uL (ref 4.22–5.81)
RDW: 11.9 % (ref 11.5–15.5)
WBC: 12.1 10*3/uL — ABNORMAL HIGH (ref 4.0–10.5)
nRBC: 0 % (ref 0.0–0.2)

## 2019-12-01 LAB — GLUCOSE, CAPILLARY
Glucose-Capillary: 155 mg/dL — ABNORMAL HIGH (ref 70–99)
Glucose-Capillary: 168 mg/dL — ABNORMAL HIGH (ref 70–99)

## 2019-12-01 LAB — CULTURE, BLOOD (ROUTINE X 2)
Culture: NO GROWTH
Special Requests: ADEQUATE

## 2019-12-01 LAB — D-DIMER, QUANTITATIVE: D-Dimer, Quant: 2.93 ug/mL-FEU — ABNORMAL HIGH (ref 0.00–0.50)

## 2019-12-01 LAB — C-REACTIVE PROTEIN: CRP: 0.5 mg/dL (ref <1.0)

## 2019-12-01 MED ORDER — LANTUS SOLOSTAR 100 UNIT/ML ~~LOC~~ SOPN: 40.0000 [IU] | PEN_INJECTOR | Freq: Two times a day (BID) | SUBCUTANEOUS | 0 refills | Status: DC

## 2019-12-01 MED ORDER — NOVOLOG FLEXPEN 100 UNIT/ML ~~LOC~~ SOPN: 15.0000 [IU] | PEN_INJECTOR | Freq: Three times a day (TID) | SUBCUTANEOUS | 0 refills | Status: DC

## 2019-12-01 MED ORDER — PREDNISONE 10 MG PO TABS
ORAL_TABLET | ORAL | 0 refills | Status: AC
Start: 2019-12-01 — End: 2019-12-13

## 2019-12-01 NOTE — Discharge Summary (Signed)
Physician Discharge Summary  Eric Kirby NID:[id number] DOB: 1980/12/09 DOA: 11/25/2019  PCP: Marlyn Corporal, PA  Admit date: 11/25/2019 Discharge date: 12/01/2019  Admitted From: Home Disposition: Home  Recommendations for Outpatient Follow-up:  1. Follow up with PCP in 1-2 weeks 2. Please obtain BMP/CBC in one week   Discharge Condition: None CODE STATUS: Stable Diet recommendation: Diabetic diet as tolerated  Brief/Interim Summary: Eric Kirby is a 39 y.o. male with a history of T2DM, protein S deficiency, DVT on xarelto who presented with shortness of breath having tested positive for SARS-CoV-2. In the ED, he was febrile up to 100.65F, tachycardic up to 120s, tachypneic and was hypoxic down to 85% on room air and placed on 4 L of O2 via nasal cannula. CRP 6.1. PCT <0.10. Chest x-ray showed mild left basilar infiltrate. Hypoxemia worsened prompting administration of tocilizumab and admission to SDU on 10L O2. Hypoxemia has since stabilized, though repeat CXR demonstrated advancing infiltrates. Completed remdesivir 8/7. Steroid dose decreased and insulin dosing has been aggressively increased due to severe hyperglycemia.  Patient admitted as above with acute hypoxic respiratory failure at intake, patient required upwards of 10 L nasal cannula initially but able to wean down over the subsequent days. Patient was discharged ultimately on room air, ambulating without hypoxia or exertion. Patient had completed Remdesivir course had received Actemra x1 with marked improvement in symptoms and inflammatory markers. Patient had remarkably elevated hyperglycemia, notably uncontrolled diabetes and was ultimately discharged on 40 units twice daily, plans to follow with PCP to taper steroids wean off given A1c of 7.6 patient is clearly uncontrolled diabetic requiring transition to insulin. Patient otherwise stable and agreeable for discharge home, to complete steroid taper at home and otherwise  follow-up with PCP as scheduled for repeat testing. Patient was educated and advised on 21-day quarantine per CDC guidelines given hospitalized patient with Covid positive BCR.  Discharge Diagnoses:  Principal Problem:   Pneumonia due to COVID-19 virus Active Problems:   Acute respiratory failure due to COVID-19 (HCC)   Sepsis (HCC)   Protein S deficiency (HCC)   Type 2 diabetes mellitus with hyperlipidemia [facility])    Discharge Instructions  Discharge Instructions    Call MD for:  difficulty breathing, headache or visual disturbances   Complete by: As directed    Call MD for:  extreme fatigue   Complete by: As directed    Call MD for:  persistant dizziness or light-headedness   Complete by: As directed    Call MD for:  temperature >100.4   Complete by: As directed    Diet Carb Modified   Complete by: As directed    Increase activity slowly   Complete by: As directed      Allergies as of 12/01/2019   No Known Allergies     Medication List    STOP taking these medications   glipiZIDE 10 MG 24 hr tablet Commonly known as: GLUCOTROL XL     TAKE these medications   albuterol 108 (90 Base) MCG/ACT inhaler Commonly known as: VENTOLIN HFA Inhale 1 puff into the lungs every 6 (six) hours as needed for wheezing or shortness of breath.   Lantus SoloStar 100 UNIT/ML Solostar Pen Generic drug: insulin glargine Inject 40 Units into the skin 2 (two) times daily.   NovoLOG FlexPen 100 UNIT/ML FlexPen Generic drug: insulin aspart Inject 15 Units into the skin 3 (three) times daily with meals.   omeprazole 40 MG capsule Commonly known as: PRILOSEC Take 40 mg  by mouth daily.   predniSONE 10 MG tablet Commonly known as: DELTASONE Take 4 tablets (40 mg total) by mouth daily for 3 days, THEN 3 tablets (30 mg total) daily for 3 days, THEN 2 tablets (20 mg total) daily for 3 days, THEN 1 tablet (10 mg total) daily for 3 days. Start taking on: December 01, 2019   rivaroxaban 20 MG Tabs  tablet Commonly known as: XARELTO Take 20 mg by mouth daily with supper.   rosuvastatin 20 MG tablet Commonly known as: CRESTOR Take 20 mg by mouth at bedtime.   Trulicity 4.5 MG/0.5ML Sopn Generic drug: Dulaglutide Inject 4.5 mg into the skin once a week.       No Known Allergies  Consultations: None  Procedures/Studies: DG CHEST PORT 1 VIEW  Result Date: 11/28/2019 CLINICAL DATA:  Shortness of breath EXAM: PORTABLE CHEST 1 VIEW COMPARISON:  November 25, 2019 FINDINGS: There is persistent ill-defined opacity in the left base region. Subtle ill-defined increased opacity in the right upper lobe is also present. There is mild interstitial prominence in the left perihilar region with somewhat equivocal airspace opacity in this area. Heart size and pulmonary vascularity are normal. No adenopathy. No bone lesions IMPRESSION: Ill-defined airspace opacity in the left base and right upper lobe and more subtly in the left perihilar region. Appearance indicative of a degree of multifocal pneumonia. Suspect atypical organism pneumonia as most likely. Heart size and pulmonary vascular normal. No adenopathy. No bone lesions. Electronically Signed   By: Bretta Bang III M.D.   On: 11/28/2019 08:22   DG Chest Port 1 View  Result Date: 11/25/2019 CLINICAL DATA:  Shortness of breath. COVID positive. EXAM: PORTABLE CHEST 1 VIEW COMPARISON:  None. FINDINGS: Decreased lung volumes are seen which is likely secondary to the degree of patient inspiration. Mild infiltrate is seen within the left lung base. There is no evidence of a pleural effusion or pneumothorax. The heart size and mediastinal contours are within normal limits. The visualized skeletal structures are unremarkable. IMPRESSION: Mild left basilar infiltrate. Electronically Signed   By: Aram Candela M.D.   On: 11/25/2019 19:24      Subjective: No acute issues or events overnight, denies nausea, vomiting, diarrhea, constipation, headache,  fevers, chills.   Discharge Exam: Vitals:   12/01/19 0800 12/01/19 0845  BP:  119/82  Pulse:  93  Resp:  15  Temp: 98.1 F (36.7 C)   SpO2:  96%   Vitals:   12/01/19 0600 12/01/19 0700 12/01/19 0800 12/01/19 0845  BP:    119/82  Pulse: 66 (!) 51  93  Resp:    15  Temp:   98.1 F (36.7 C)   TempSrc:   Oral   SpO2: 94% (!) 87%  96%  Weight:      Height:        General: Pt is alert, awake, not in acute distress Cardiovascular: RRR, S1/S2 +, no rubs, no gallops Respiratory: CTA bilaterally, no wheezing, no rhonchi Abdominal: Soft, NT, ND, bowel sounds + Extremities: no edema, no cyanosis    The results of significant diagnostics from this hospitalization (including imaging, microbiology, ancillary and laboratory) are listed below for reference.     Microbiology: Recent Results (from the past 240 hour(s))  Blood Culture (routine x 2)     Status: None   Collection Time: 11/25/19  6:48 PM   Specimen: BLOOD  Result Value Ref Range Status   Specimen Description   Final  BLOOD LEFT ANTECUBITAL Performed at [facility], [address], [place], [place] [address]    Special Requests   Final    BOTTLES DRAWN AEROBIC AND ANAEROBIC Blood Culture adequate volume Performed at [facility], [address] [place], [place] [address]    Culture   Final    NO GROWTH 5 DAYS Performed at [facility], [address] [place], [place] [address]    Report Status 11/30/2019 FINAL  Final  Blood Culture (routine x 2)     Status: None   Collection Time: 11/25/19  8:00 PM   Specimen: BLOOD  Result Value Ref Range Status   Specimen Description   Final    BLOOD BLOOD LEFT FOREARM Performed at [facility], [address], [place], [place] [address]    Special Requests   Final    BOTTLES DRAWN AEROBIC AND ANAEROBIC Blood Culture adequate volume Performed at [facility], [address],  [place], [place] [address]    Culture   Final    NO GROWTH 5 DAYS Performed at [facility], [address], [place], [place] [address]    Report Status 12/01/2019 FINAL  Final  SARS Coronavirus 2 by RT PCR (hospital order, performed in [facility] hospital lab) Nasopharyngeal Nasopharyngeal Swab     Status: Abnormal   Collection Time: 11/25/19  9:30 PM   Specimen: Nasopharyngeal Swab  Result Value Ref Range Status   SARS Coronavirus 2 POSITIVE (A) NEGATIVE Final    Comment: RESULT CALLED TO, READ BACK BY AND VERIFIED WITH: RN H LACIVITA AT 008 11/26/19 CRUICKSHANK A (NOTE) SARS-CoV-2 target nucleic acids are DETECTED  SARS-CoV-2 RNA is generally detectable in upper respiratory specimens  during the acute phase of infection.  Positive results are indicative  of the presence of the identified virus, but do not rule out bacterial infection or co-infection with other pathogens not detected by the test.  Clinical correlation with patient history and  other diagnostic information is necessary to determine patient infection status.  The expected result is negative.  Fact Sheet for Patients:   BoilerBrush.com.cy   Fact Sheet for Healthcare Providers:   https://pope.com/    This test is not yet approved or cleared by the [place] FDA and  has been authorized for detection and/or diagnosis of SARS-CoV-2 by FDA under an Emergency Use Authorization (EUA).  This EUA will remain in effect (meaning  this test can be used) for the duration of  the COVID-19 declaration under Section 564(b)(1) of the Act, 21 U.S.C. section 360-bbb-3(b)(1), unless the authorization is terminated or revoked sooner.  Performed at [facility], [address] [place], [place] [address]   MRSA PCR Screening     Status: None   Collection Time: 11/27/19  2:52 PM   Specimen: Nasopharyngeal  Result Value Ref Range Status   MRSA by PCR NEGATIVE  NEGATIVE Final    Comment:        The GeneXpert MRSA Assay (FDA approved for NASAL specimens only), is one component of a comprehensive MRSA colonization surveillance program. It is not intended to diagnose MRSA infection nor to guide or monitor treatment for MRSA infections. Performed at [facility], [address] [place], [place] [address]      Labs: BNP (last 3 results) No results for input(s): BNP in the last 8760 hours. Basic Metabolic Panel: Recent Labs  Lab 11/27/19 0549 11/28/19 0240  11/29/19 0243 11/30/19 0100 12/01/19 0301  NA 135 135 135 138 130*  K 4.7 4.5 5.2* 4.8 5.0  CL 101 101 100 101 95*  CO2 22 25 26 28 28   GLUCOSE 341* 301* 313* 213* 290*  BUN 24* 25* 26* 31* 26*  CREATININE 0.74 0.62 0.65 0.58* 0.71  CALCIUM 8.9 8.3* 8.4* 8.7* 8.2*   Liver Function Tests: Recent Labs  Lab 11/27/19 0549 11/28/19 0240 11/29/19 0243 11/30/19 0100 12/01/19 0301  AST 25 24 24 29 25   ALT 55* 52* 60* 69* 72*  ALKPHOS 49 44 48 54 53  BILITOT 0.8 0.8 0.5 0.8 0.5  PROT 6.9 6.1* 6.4* 6.5 6.3*  ALBUMIN 3.5 3.0* 3.1* 3.4* 3.4*   No results for input(s): LIPASE, AMYLASE in the last 168 hours. No results for input(s): AMMONIA in the last 168 hours. CBC: Recent Labs  Lab 11/25/19 1848 11/25/19 1848 11/26/19 0439 11/27/19 0549 11/28/19 0240 11/29/19 0243 12/01/19 0301  WBC 6.4   < > 4.4 7.2 8.3 10.2 12.1*  NEUTROABS 5.4  --  3.5 5.8 6.7 8.0*  --   HGB 15.5   < > 14.9 15.3 14.4 14.4 15.3  HCT 45.4   < > 45.2 45.9 42.6 43.7 46.7  MCV 84.5   < > 85.4 84.4 84.2 84.9 85.2  PLT 152   < > 150 194 209 244 235   < > = values in this interval not displayed.   Cardiac Enzymes: No results for input(s): CKTOTAL, CKMB, CKMBINDEX, TROPONINI in the last 168 hours. BNP: Invalid input(s): POCBNP CBG: Recent Labs  Lab 11/30/19 0748 11/30/19 1235 11/30/19 1550 11/30/19 2052 12/01/19 0838  GLUCAP 198* 398* 292* 174* 155*   D-Dimer Recent Labs     11/29/19 0243 12/01/19 0301  DDIMER 1.48* 2.93*   Hgb A1c No results for input(s): HGBA1C in the last 72 hours. Lipid Profile No results for input(s): CHOL, HDL, LDLCALC, TRIG, CHOLHDL, LDLDIRECT in the last 72 hours. Thyroid function studies No results for input(s): TSH, T4TOTAL, T3FREE, THYROIDAB in the last 72 hours.  Invalid input(s): FREET3 Anemia work up No results for input(s): VITAMINB12, FOLATE, FERRITIN, TIBC, IRON, RETICCTPCT in the last 72 hours. Urinalysis No results found for: COLORURINE, APPEARANCEUR, LABSPEC, PHURINE, GLUCOSEU, HGBUR, BILIRUBINUR, KETONESUR, PROTEINUR, UROBILINOGEN, NITRITE, LEUKOCYTESUR Sepsis Labs Invalid input(s): PROCALCITONIN,  WBC,  LACTICIDVEN Microbiology Recent Results (from the past 240 hour(s))  Blood Culture (routine x 2)     Status: None   Collection Time: 11/25/19  6:48 PM   Specimen: BLOOD  Result Value Ref Range Status   Specimen Description   Final    BLOOD LEFT ANTECUBITAL Performed at [facility], [address] [place], [place]    Special Requests   Final    BOTTLES DRAWN AEROBIC AND ANAEROBIC Blood Culture adequate volume Performed at [facility], [address], [place], [place]    Culture   Final    NO GROWTH 5 DAYS Performed at [facility], [address] [place], [address] [place]    Report Status 11/30/2019 FINAL  Final  Blood Culture (routine x 2)     Status: None   Collection Time: 11/25/19  8:00 PM   Specimen: BLOOD  Result Value Ref Range Status   Specimen Description   Final    BLOOD BLOOD LEFT FOREARM Performed at [facility], [address], [place], [place]    Special Requests  Final    BOTTLES DRAWN AEROBIC AND ANAEROBIC Blood Culture adequate volume Performed at [facility], [address] [place], [place] [address]    Culture   Final    NO GROWTH 5 DAYS Performed at [facility], [address] [place], [place] [address]    Report Status 12/01/2019 FINAL  Final  SARS Coronavirus 2 by RT PCR (hospital order, performed in [facility] hospital lab) Nasopharyngeal Nasopharyngeal Swab     Status: Abnormal   Collection Time: 11/25/19  9:30 PM   Specimen: Nasopharyngeal Swab  Result Value Ref Range Status   SARS Coronavirus 2 POSITIVE (A) NEGATIVE Final    Comment: RESULT CALLED TO, READ BACK BY AND VERIFIED WITH: RN H LACIVITA AT 008 11/26/19 CRUICKSHANK A (NOTE) SARS-CoV-2 target nucleic acids are DETECTED  SARS-CoV-2 RNA is generally detectable in upper respiratory specimens  during the acute phase of infection.  Positive results are indicative  of the presence of the identified virus, but do not rule out bacterial infection or co-infection with other pathogens not detected by the test.  Clinical correlation with patient history and  other diagnostic information is necessary to determine patient infection status.  The expected result is negative.  Fact Sheet for Patients:   BoilerBrush.com.cyhttps://www.fda.gov/media/136312/download   Fact Sheet for Healthcare Providers:   https://pope.com/https://www.fda.gov/media/136313/download    This test is not yet approved or cleared by the [place] FDA and  has been authorized for detection and/or diagnosis of SARS-CoV-2 by FDA under an Emergency Use Authorization (EUA).  This EUA will remain in effect (meaning  this test can be used) for the duration of  the COVID-19 declaration under Section 564(b)(1) of the Act, 21 U.S.C. section 360-bbb-3(b)(1), unless the authorization is terminated or revoked sooner.  Performed at [facility], [address] [place], [place] [address]   MRSA PCR Screening     Status: None   Collection Time: 11/27/19  2:52 PM   Specimen: Nasopharyngeal  Result Value Ref Range Status   MRSA by PCR NEGATIVE NEGATIVE Final    Comment:        The GeneXpert MRSA Assay (FDA approved for NASAL  specimens only), is one component of a comprehensive MRSA colonization surveillance program. It is not intended to diagnose MRSA infection nor to guide or monitor treatment for MRSA infections. Performed at [facility], [address] [place], [place] [address]      Time coordinating discharge: Over 30 minutes  SIGNED:   Azucena FallenWilliam C Selena Swaminathan, DO [facility] 12/01/2019, 11:34 AM Pager   If 7PM-7AM, please contact night-coverage [email or web address]

## 2019-12-01 NOTE — TOC Progression Note (Signed)
Transition of Care [facility]) - Progression Note    Patient Details  Name: Eric Kirby MRN: [id number] Date of Birth: 11-03-1980  Transition of Care [facility]) CM/SW Contact  Golda Acre, RN Phone Number: 12/01/2019, 11:19 AM  Clinical Narrative:    Dennie Bible in icu [place] at 1.5 l/min , iv covid meds, covid labs elevated, wbc 12.1, bld cultures x2x5d=neg. Unvaccinated male.  Following for progression and toc needs.  Expected Discharge Plan: Home/Self Care Barriers to Discharge: Continued Medical Work up  Expected Discharge Plan and Services Expected Discharge Plan: Home/Self Care   Discharge Planning Services: CM Consult   Living arrangements for the past 2 months: Single Family Home                                       Social Determinants of Health (SDOH) Interventions    Readmission Risk Interventions No flowsheet data found.

## 2019-12-01 NOTE — Progress Notes (Signed)
Pt discharged. PIV removed and AVS explained to pt. All belongings were returned. Pt wheeled out to the front by this RN.

## 2019-12-03 DIAGNOSIS — D6859 Other primary thrombophilia: Secondary | ICD-10-CM | POA: Diagnosis not present

## 2019-12-03 DIAGNOSIS — U071 COVID-19: Secondary | ICD-10-CM | POA: Diagnosis not present

## 2019-12-03 DIAGNOSIS — E1169 Type 2 diabetes mellitus with other specified complication: Secondary | ICD-10-CM | POA: Diagnosis not present

## 2019-12-03 DIAGNOSIS — E7800 Pure hypercholesterolemia, unspecified: Secondary | ICD-10-CM | POA: Diagnosis not present

## 2019-12-03 DIAGNOSIS — Z79899 Other long term (current) drug therapy: Secondary | ICD-10-CM | POA: Diagnosis not present

## 2019-12-17 DIAGNOSIS — E1169 Type 2 diabetes mellitus with other specified complication: Secondary | ICD-10-CM | POA: Diagnosis not present

## 2019-12-17 DIAGNOSIS — E7800 Pure hypercholesterolemia, unspecified: Secondary | ICD-10-CM | POA: Diagnosis not present

## 2019-12-17 DIAGNOSIS — R12 Heartburn: Secondary | ICD-10-CM | POA: Diagnosis not present

## 2019-12-17 DIAGNOSIS — D6859 Other primary thrombophilia: Secondary | ICD-10-CM | POA: Diagnosis not present

## 2020-01-26 DIAGNOSIS — R7989 Other specified abnormal findings of blood chemistry: Secondary | ICD-10-CM | POA: Diagnosis not present

## 2020-01-26 DIAGNOSIS — Z8616 Personal history of COVID-19: Secondary | ICD-10-CM | POA: Diagnosis not present

## 2020-03-19 DIAGNOSIS — E1169 Type 2 diabetes mellitus with other specified complication: Secondary | ICD-10-CM | POA: Diagnosis not present

## 2020-03-19 DIAGNOSIS — E7800 Pure hypercholesterolemia, unspecified: Secondary | ICD-10-CM | POA: Diagnosis not present

## 2020-03-19 DIAGNOSIS — R12 Heartburn: Secondary | ICD-10-CM | POA: Diagnosis not present

## 2020-03-19 DIAGNOSIS — D6859 Other primary thrombophilia: Secondary | ICD-10-CM | POA: Diagnosis not present

## 2020-06-16 DIAGNOSIS — D6859 Other primary thrombophilia: Secondary | ICD-10-CM | POA: Diagnosis not present

## 2020-06-16 DIAGNOSIS — R12 Heartburn: Secondary | ICD-10-CM | POA: Diagnosis not present

## 2020-06-16 DIAGNOSIS — E7800 Pure hypercholesterolemia, unspecified: Secondary | ICD-10-CM | POA: Diagnosis not present

## 2020-06-16 DIAGNOSIS — E1169 Type 2 diabetes mellitus with other specified complication: Secondary | ICD-10-CM | POA: Diagnosis not present

## 2020-06-28 DIAGNOSIS — E119 Type 2 diabetes mellitus without complications: Secondary | ICD-10-CM | POA: Diagnosis not present

## 2020-06-28 DIAGNOSIS — Z6828 Body mass index (BMI) 28.0-28.9, adult: Secondary | ICD-10-CM | POA: Diagnosis not present

## 2020-09-13 DIAGNOSIS — D6859 Other primary thrombophilia: Secondary | ICD-10-CM | POA: Diagnosis not present

## 2020-09-13 DIAGNOSIS — E7800 Pure hypercholesterolemia, unspecified: Secondary | ICD-10-CM | POA: Diagnosis not present

## 2020-09-13 DIAGNOSIS — R12 Heartburn: Secondary | ICD-10-CM | POA: Diagnosis not present

## 2020-09-13 DIAGNOSIS — E1169 Type 2 diabetes mellitus with other specified complication: Secondary | ICD-10-CM | POA: Diagnosis not present

## 2020-09-13 DIAGNOSIS — E782 Mixed hyperlipidemia: Secondary | ICD-10-CM | POA: Diagnosis not present

## 2020-12-16 DIAGNOSIS — D6859 Other primary thrombophilia: Secondary | ICD-10-CM | POA: Diagnosis not present

## 2020-12-16 DIAGNOSIS — R748 Abnormal levels of other serum enzymes: Secondary | ICD-10-CM | POA: Diagnosis not present

## 2020-12-16 DIAGNOSIS — R12 Heartburn: Secondary | ICD-10-CM | POA: Diagnosis not present

## 2020-12-16 DIAGNOSIS — E1169 Type 2 diabetes mellitus with other specified complication: Secondary | ICD-10-CM | POA: Diagnosis not present

## 2020-12-16 DIAGNOSIS — E782 Mixed hyperlipidemia: Secondary | ICD-10-CM | POA: Diagnosis not present

## 2020-12-16 DIAGNOSIS — E7800 Pure hypercholesterolemia, unspecified: Secondary | ICD-10-CM | POA: Diagnosis not present

## 2021-01-21 DIAGNOSIS — R7989 Other specified abnormal findings of blood chemistry: Secondary | ICD-10-CM | POA: Diagnosis not present

## 2021-02-25 DIAGNOSIS — R7989 Other specified abnormal findings of blood chemistry: Secondary | ICD-10-CM | POA: Diagnosis not present

## 2021-03-03 DIAGNOSIS — E1169 Type 2 diabetes mellitus with other specified complication: Secondary | ICD-10-CM | POA: Diagnosis not present

## 2021-03-03 DIAGNOSIS — D6859 Other primary thrombophilia: Secondary | ICD-10-CM | POA: Diagnosis not present

## 2021-03-03 DIAGNOSIS — Z23 Encounter for immunization: Secondary | ICD-10-CM | POA: Diagnosis not present

## 2021-03-03 DIAGNOSIS — R12 Heartburn: Secondary | ICD-10-CM | POA: Diagnosis not present

## 2021-03-03 DIAGNOSIS — E7800 Pure hypercholesterolemia, unspecified: Secondary | ICD-10-CM | POA: Diagnosis not present

## 2021-03-31 ENCOUNTER — Other Ambulatory Visit: Payer: Self-pay | Admitting: Family Medicine

## 2021-03-31 DIAGNOSIS — R748 Abnormal levels of other serum enzymes: Secondary | ICD-10-CM

## 2021-04-04 ENCOUNTER — Ambulatory Visit
Admission: RE | Admit: 2021-04-04 | Discharge: 2021-04-04 | Disposition: A | Discharge status: Home / Self Care | Payer: BC Managed Care – PPO | Source: Ambulatory Visit | Attending: Family Medicine | Admitting: Family Medicine

## 2021-04-04 DIAGNOSIS — R945 Abnormal results of liver function studies: Secondary | ICD-10-CM | POA: Diagnosis not present

## 2021-04-04 DIAGNOSIS — K760 Fatty (change of) liver, not elsewhere classified: Secondary | ICD-10-CM | POA: Diagnosis not present

## 2021-04-04 DIAGNOSIS — R748 Abnormal levels of other serum enzymes: Secondary | ICD-10-CM

## 2021-04-07 DIAGNOSIS — E1169 Type 2 diabetes mellitus with other specified complication: Secondary | ICD-10-CM | POA: Diagnosis not present

## 2021-04-07 DIAGNOSIS — E782 Mixed hyperlipidemia: Secondary | ICD-10-CM | POA: Diagnosis not present

## 2021-04-07 DIAGNOSIS — R12 Heartburn: Secondary | ICD-10-CM | POA: Diagnosis not present

## 2021-04-07 DIAGNOSIS — D6859 Other primary thrombophilia: Secondary | ICD-10-CM | POA: Diagnosis not present

## 2021-04-07 DIAGNOSIS — Z1331 Encounter for screening for depression: Secondary | ICD-10-CM | POA: Diagnosis not present

## 2021-04-07 DIAGNOSIS — R748 Abnormal levels of other serum enzymes: Secondary | ICD-10-CM | POA: Diagnosis not present

## 2021-04-08 ENCOUNTER — Encounter: Payer: Self-pay | Admitting: Nurse Practitioner

## 2021-05-06 ENCOUNTER — Ambulatory Visit: Payer: BC Managed Care – PPO | Admitting: Nurse Practitioner

## 2021-05-06 ENCOUNTER — Encounter (INDEPENDENT_AMBULATORY_CARE_PROVIDER_SITE_OTHER): Payer: Self-pay

## 2021-05-12 ENCOUNTER — Telehealth (INDEPENDENT_AMBULATORY_CARE_PROVIDER_SITE_OTHER): Payer: BC Managed Care – PPO | Admitting: Gastroenterology

## 2021-05-12 ENCOUNTER — Encounter: Payer: Self-pay | Admitting: Gastroenterology

## 2021-05-12 ENCOUNTER — Telehealth: Payer: Self-pay | Admitting: Gastroenterology

## 2021-05-12 DIAGNOSIS — K760 Fatty (change of) liver, not elsewhere classified: Secondary | ICD-10-CM

## 2021-05-12 DIAGNOSIS — R748 Abnormal levels of other serum enzymes: Secondary | ICD-10-CM | POA: Diagnosis not present

## 2021-05-12 NOTE — Patient Instructions (Addendum)
Patient will come to the lab at some point in the near future to have lab studies drawn.  We discussed good diet, exercise, weight loss, good blood sugar and cholesterol control in regards to the fatty liver.  He will bring a list of his medications to the office as well when he comes to have his labs drawn as we were not able to review those today.
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
## 2021-05-12 NOTE — Telephone Encounter (Signed)
Mr. Eric Kirby, Eric Kirby are scheduled for a virtual visit with your provider today.    Just as we do with appointments in the office, we must obtain your consent to participate.  Your consent will be active for this visit and any virtual visit you may have with one of our providers in the next 365 days.    If you have a MyChart account, I can also send a copy of this consent to you electronically.  All virtual visits are billed to your insurance company just like a traditional visit in the office.  As this is a virtual visit, video technology does not allow for your provider to perform a traditional examination.  This may limit your provider's ability to fully assess your condition.  If your provider identifies any concerns that need to be evaluated in person or the need to arrange testing such as labs, EKG, etc, we will make arrangements to do so.    Although advances in technology are sophisticated, we cannot ensure that it will always work on either your end or our end.  If the connection with a video visit is poor, we may have to switch to a telephone visit.  With either a video or telephone visit, we are not always able to ensure that we have a secure connection.   I need to obtain your verbal consent now.   Are you willing to proceed with your visit today?   Eric Kirby has provided verbal consent on 05/12/2021 for a virtual visit (video or telephone).   Princella Pellegrini. Daysha Ashmore, PA-C 05/12/2021  3:43 PM

## 2021-05-12 NOTE — Progress Notes (Signed)
Reviewed and agree with management plans. Suspected fatty liver. If serologic evaluation for other causes is negative, will proceed with staging given his concurrent diabetes.   Ona Rathert L. Tarri Glenn, MD, MPH

## 2021-05-13 ENCOUNTER — Other Ambulatory Visit (INDEPENDENT_AMBULATORY_CARE_PROVIDER_SITE_OTHER): Payer: BC Managed Care – PPO

## 2021-05-13 DIAGNOSIS — K760 Fatty (change of) liver, not elsewhere classified: Secondary | ICD-10-CM | POA: Diagnosis not present

## 2021-05-13 DIAGNOSIS — R748 Abnormal levels of other serum enzymes: Secondary | ICD-10-CM

## 2021-05-13 LAB — COMPREHENSIVE METABOLIC PANEL
ALT: 138 U/L — ABNORMAL HIGH (ref 0–53)
AST: 49 U/L — ABNORMAL HIGH (ref 0–37)
Albumin: 4.7 g/dL (ref 3.5–5.2)
Alkaline Phosphatase: 81 U/L (ref 39–117)
BUN: 17 mg/dL (ref 6–23)
CO2: 29 mEq/L (ref 19–32)
Calcium: 9.7 mg/dL (ref 8.4–10.5)
Chloride: 99 mEq/L (ref 96–112)
Creatinine, Ser: 0.85 mg/dL (ref 0.40–1.50)
GFR: 110.49 mL/min (ref >60.00)
Glucose, Bld: 167 mg/dL — ABNORMAL HIGH (ref 70–99)
Potassium: 3.9 mEq/L (ref 3.5–5.1)
Sodium: 136 mEq/L (ref 135–145)
Total Bilirubin: 0.8 mg/dL (ref 0.2–1.2)
Total Protein: 7.8 g/dL (ref 6.0–8.3)

## 2021-05-13 LAB — CBC WITH DIFFERENTIAL/PLATELET
Basophils Absolute: 0 10*3/uL (ref 0.0–0.1)
Basophils Relative: 0.4 % (ref 0.0–3.0)
Eosinophils Absolute: 0.1 10*3/uL (ref 0.0–0.7)
Eosinophils Relative: 1.2 % (ref 0.0–5.0)
HCT: 47.8 % (ref 39.0–52.0)
Hemoglobin: 16.2 g/dL (ref 13.0–17.0)
Lymphocytes Relative: 32.1 % (ref 12.0–46.0)
Lymphs Abs: 2.5 10*3/uL (ref 0.7–4.0)
MCHC: 33.9 g/dL (ref 30.0–36.0)
MCV: 82.2 fl (ref 78.0–100.0)
Monocytes Absolute: 0.5 10*3/uL (ref 0.1–1.0)
Monocytes Relative: 6.8 % (ref 3.0–12.0)
Neutro Abs: 4.6 10*3/uL (ref 1.4–7.7)
Neutrophils Relative %: 59.5 % (ref 43.0–77.0)
Platelets: 213 10*3/uL (ref 150.0–400.0)
RBC: 5.82 Mil/uL — ABNORMAL HIGH (ref 4.22–5.81)
RDW: 12.9 % (ref 11.5–15.5)
WBC: 7.7 10*3/uL (ref 4.0–10.5)

## 2021-05-13 LAB — IBC + FERRITIN
Ferritin: 110.3 ng/mL (ref 22.0–322.0)
Iron: 65 ug/dL (ref 42–165)
Saturation Ratios: 16.2 % — ABNORMAL LOW (ref 20.0–50.0)
TIBC: 400.4 ug/dL (ref 250.0–450.0)
Transferrin: 286 mg/dL (ref 212.0–360.0)

## 2021-05-13 NOTE — Addendum Note (Signed)
Addended by: Arville Care on: 05/13/2021 01:21 PM   Modules accepted: Orders

## 2021-05-17 ENCOUNTER — Telehealth: Payer: Self-pay | Admitting: Gastroenterology

## 2021-05-17 NOTE — Telephone Encounter (Signed)
Shanda Bumps have you had a chance to look at this patients labs?  He is calling looking for results.  Thank you

## 2021-05-17 NOTE — Telephone Encounter (Signed)
Noted thank you

## 2021-05-17 NOTE — Telephone Encounter (Signed)
Inbound call from patient would like a call back to discuss lab results.

## 2021-05-18 LAB — HEPATITIS B SURFACE ANTIBODY,QUALITATIVE: Hep B S Ab: REACTIVE — AB

## 2021-05-18 LAB — ANA: Anti Nuclear Antibody (ANA): NEGATIVE

## 2021-05-18 LAB — IGG: IgG (Immunoglobin G), Serum: 1071 mg/dL (ref 600–1640)

## 2021-05-18 LAB — TISSUE TRANSGLUTAMINASE ABS,IGG,IGA
(tTG) Ab, IgA: <1 U/mL
(tTG) Ab, IgG: <1 U/mL

## 2021-05-18 LAB — ALPHA-1-ANTITRYPSIN: A-1 Antitrypsin, Ser: 151 mg/dL (ref 83–199)

## 2021-05-18 LAB — ANTI-SMOOTH MUSCLE ANTIBODY, IGG: Actin (Smooth Muscle) Antibody (IGG): <20 U (ref <20)

## 2021-05-18 LAB — IGA: Immunoglobulin A: 374 mg/dL — ABNORMAL HIGH (ref 47–310)

## 2021-05-18 LAB — HEPATITIS C ANTIBODY
Hepatitis C Ab: NONREACTIVE
SIGNAL TO CUT-OFF: 0.08

## 2021-05-18 LAB — HEPATITIS B SURFACE ANTIGEN: Hepatitis B Surface Ag: NONREACTIVE

## 2021-05-18 LAB — CERULOPLASMIN: Ceruloplasmin: 13 mg/dL — ABNORMAL LOW (ref 18–36)

## 2021-05-18 LAB — MITOCHONDRIAL ANTIBODIES: Mitochondrial M2 Ab, IgG: <=20 U (ref <=20.0)

## 2021-05-20 ENCOUNTER — Other Ambulatory Visit: Payer: Self-pay

## 2021-05-20 DIAGNOSIS — R899 Unspecified abnormal finding in specimens from other organs, systems and tissues: Secondary | ICD-10-CM

## 2021-05-27 DIAGNOSIS — E119 Type 2 diabetes mellitus without complications: Secondary | ICD-10-CM | POA: Diagnosis not present

## 2021-06-01 ENCOUNTER — Other Ambulatory Visit: Payer: BC Managed Care – PPO

## 2021-06-03 ENCOUNTER — Other Ambulatory Visit: Payer: BC Managed Care – PPO

## 2021-06-03 DIAGNOSIS — R899 Unspecified abnormal finding in specimens from other organs, systems and tissues: Secondary | ICD-10-CM

## 2021-06-07 LAB — COPPER, URINE, 24 HOUR
Copper,Urine (24 Hr): 16 mcg/24 h (ref 15–60)
Total Volume: 1975 mL

## 2021-06-07 LAB — EXTRA SPECIMEN

## 2021-06-09 ENCOUNTER — Other Ambulatory Visit: Payer: Self-pay

## 2021-06-09 DIAGNOSIS — K760 Fatty (change of) liver, not elsewhere classified: Secondary | ICD-10-CM

## 2021-06-09 DIAGNOSIS — R748 Abnormal levels of other serum enzymes: Secondary | ICD-10-CM

## 2021-06-21 DIAGNOSIS — R0602 Shortness of breath: Secondary | ICD-10-CM | POA: Diagnosis not present

## 2021-06-21 DIAGNOSIS — D6859 Other primary thrombophilia: Secondary | ICD-10-CM | POA: Diagnosis not present

## 2021-06-21 DIAGNOSIS — E785 Hyperlipidemia, unspecified: Secondary | ICD-10-CM | POA: Diagnosis not present

## 2021-06-21 DIAGNOSIS — E663 Overweight: Secondary | ICD-10-CM | POA: Diagnosis not present

## 2021-06-21 DIAGNOSIS — E1169 Type 2 diabetes mellitus with other specified complication: Secondary | ICD-10-CM | POA: Diagnosis not present

## 2021-07-07 DIAGNOSIS — R0602 Shortness of breath: Secondary | ICD-10-CM | POA: Diagnosis not present

## 2021-07-07 DIAGNOSIS — E785 Hyperlipidemia, unspecified: Secondary | ICD-10-CM | POA: Diagnosis not present

## 2021-07-07 DIAGNOSIS — I509 Heart failure, unspecified: Secondary | ICD-10-CM | POA: Diagnosis not present

## 2021-07-07 DIAGNOSIS — E1169 Type 2 diabetes mellitus with other specified complication: Secondary | ICD-10-CM | POA: Diagnosis not present

## 2021-07-07 DIAGNOSIS — Z79899 Other long term (current) drug therapy: Secondary | ICD-10-CM | POA: Diagnosis not present

## 2021-07-07 DIAGNOSIS — K760 Fatty (change of) liver, not elsewhere classified: Secondary | ICD-10-CM | POA: Diagnosis not present

## 2021-07-07 DIAGNOSIS — E7800 Pure hypercholesterolemia, unspecified: Secondary | ICD-10-CM | POA: Diagnosis not present

## 2021-07-26 DIAGNOSIS — R748 Abnormal levels of other serum enzymes: Secondary | ICD-10-CM | POA: Diagnosis not present

## 2021-07-26 DIAGNOSIS — R791 Abnormal coagulation profile: Secondary | ICD-10-CM | POA: Diagnosis not present

## 2021-07-26 DIAGNOSIS — E7800 Pure hypercholesterolemia, unspecified: Secondary | ICD-10-CM | POA: Diagnosis not present

## 2021-07-26 DIAGNOSIS — E1169 Type 2 diabetes mellitus with other specified complication: Secondary | ICD-10-CM | POA: Diagnosis not present

## 2021-10-14 DIAGNOSIS — E7800 Pure hypercholesterolemia, unspecified: Secondary | ICD-10-CM | POA: Diagnosis not present

## 2021-10-14 DIAGNOSIS — E1169 Type 2 diabetes mellitus with other specified complication: Secondary | ICD-10-CM | POA: Diagnosis not present

## 2021-10-14 DIAGNOSIS — E785 Hyperlipidemia, unspecified: Secondary | ICD-10-CM | POA: Diagnosis not present

## 2021-10-14 DIAGNOSIS — K760 Fatty (change of) liver, not elsewhere classified: Secondary | ICD-10-CM | POA: Diagnosis not present

## 2021-10-14 DIAGNOSIS — Z79899 Other long term (current) drug therapy: Secondary | ICD-10-CM | POA: Diagnosis not present

## 2021-10-21 DIAGNOSIS — E875 Hyperkalemia: Secondary | ICD-10-CM | POA: Diagnosis not present

## 2021-11-03 DIAGNOSIS — E875 Hyperkalemia: Secondary | ICD-10-CM | POA: Diagnosis not present

## 2021-12-09 DIAGNOSIS — E875 Hyperkalemia: Secondary | ICD-10-CM | POA: Diagnosis not present

## 2021-12-09 DIAGNOSIS — E1169 Type 2 diabetes mellitus with other specified complication: Secondary | ICD-10-CM | POA: Diagnosis not present

## 2021-12-09 DIAGNOSIS — E7800 Pure hypercholesterolemia, unspecified: Secondary | ICD-10-CM | POA: Diagnosis not present

## 2022-03-15 DIAGNOSIS — R0981 Nasal congestion: Secondary | ICD-10-CM | POA: Diagnosis not present

## 2022-03-15 DIAGNOSIS — R051 Acute cough: Secondary | ICD-10-CM | POA: Diagnosis not present

## 2022-03-15 DIAGNOSIS — J019 Acute sinusitis, unspecified: Secondary | ICD-10-CM | POA: Diagnosis not present

## 2022-05-12 ENCOUNTER — Other Ambulatory Visit: Payer: BC Managed Care – PPO

## 2022-05-12 ENCOUNTER — Encounter: Payer: BC Managed Care – PPO | Admitting: Oncology

## 2022-05-24 IMAGING — DX DG CHEST 1V PORT
1 series · 1 of 1 positions shown · non-contrast
Comparison: None.

CLINICAL DATA: Shortness of breath. COVID positive.

EXAM:
PORTABLE CHEST 1 VIEW

[chest ap]
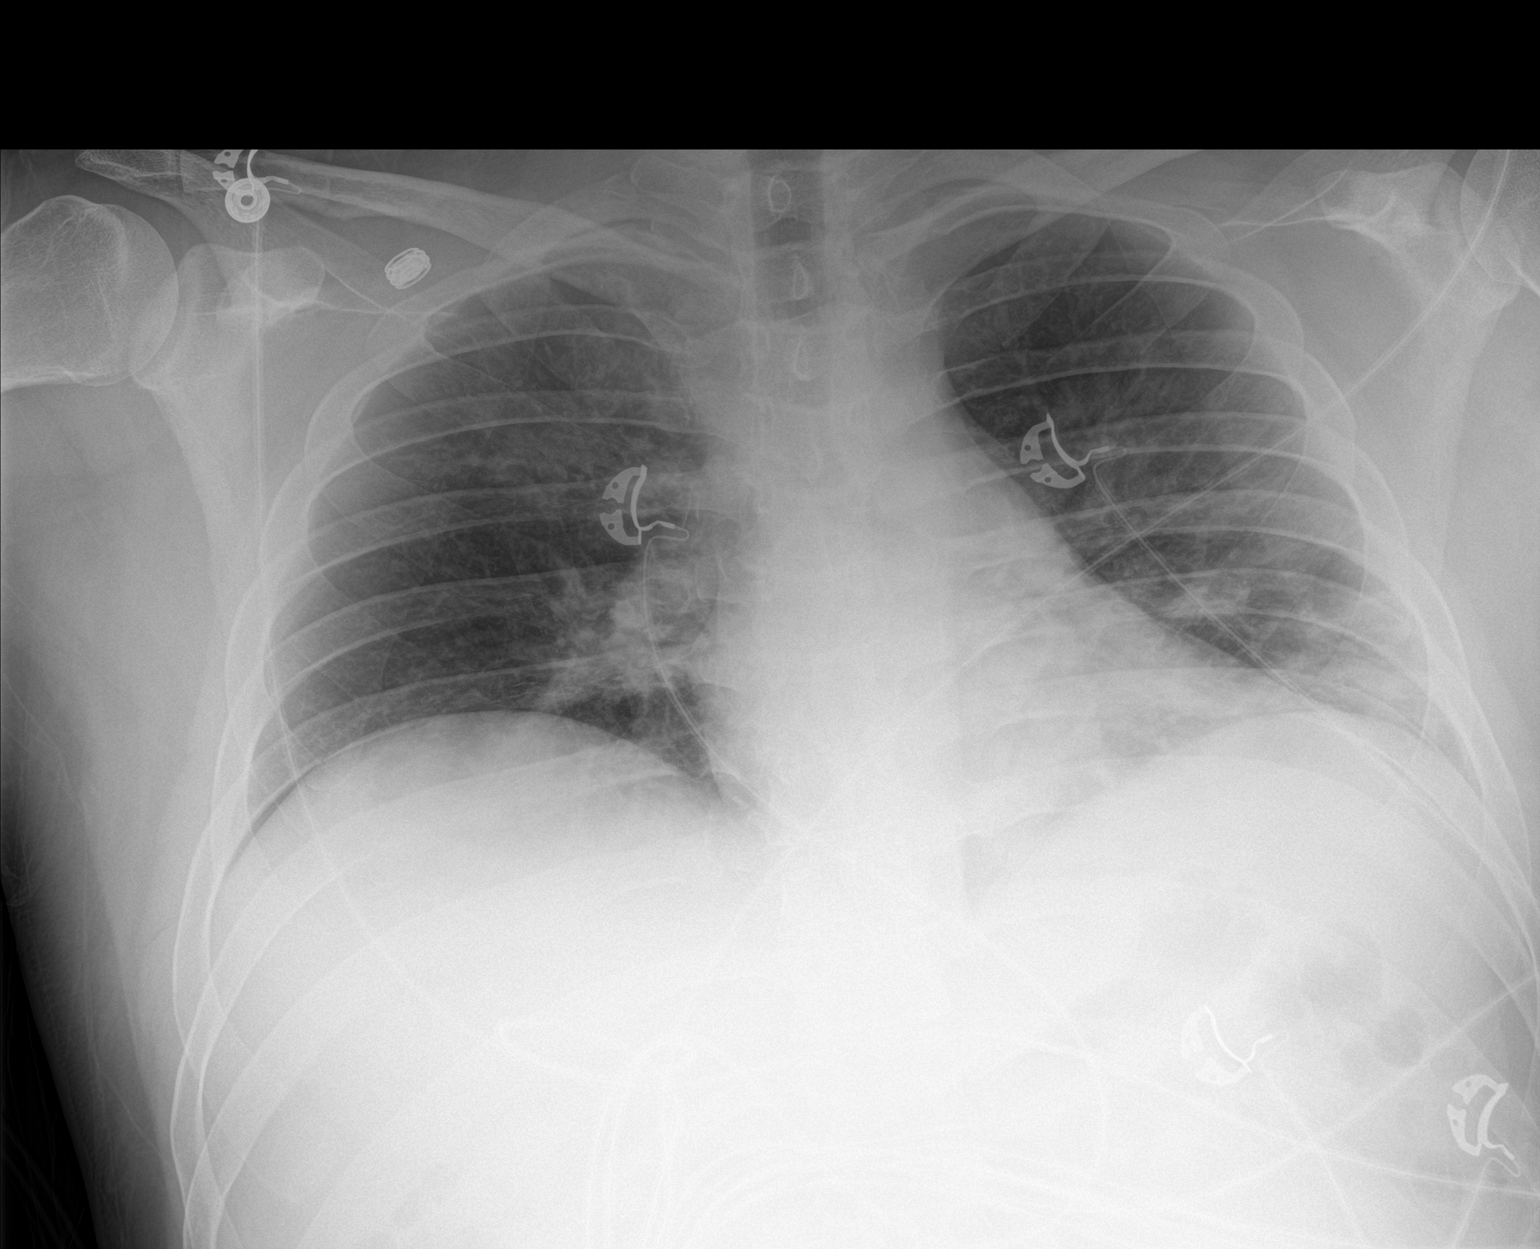

[1 of 1 positions shown; findings below may reference images not displayed]

FINDINGS: Decreased lung volumes are seen which is likely secondary to the
degree of patient inspiration. Mild infiltrate is seen within the
left lung base. There is no evidence of a pleural effusion or
pneumothorax. The heart size and mediastinal contours are within
normal limits. The visualized skeletal structures are unremarkable.
IMPRESSION: Mild left basilar infiltrate.

## 2022-05-26 ENCOUNTER — Other Ambulatory Visit: Payer: BC Managed Care – PPO

## 2022-05-26 ENCOUNTER — Encounter: Payer: BC Managed Care – PPO | Admitting: Oncology

## 2022-05-27 IMAGING — DX DG CHEST 1V PORT
1 series · 1 of 1 positions shown · non-contrast
Comparison: November 25, 2019

CLINICAL DATA: Shortness of breath

EXAM:
PORTABLE CHEST 1 VIEW

[chest ap]
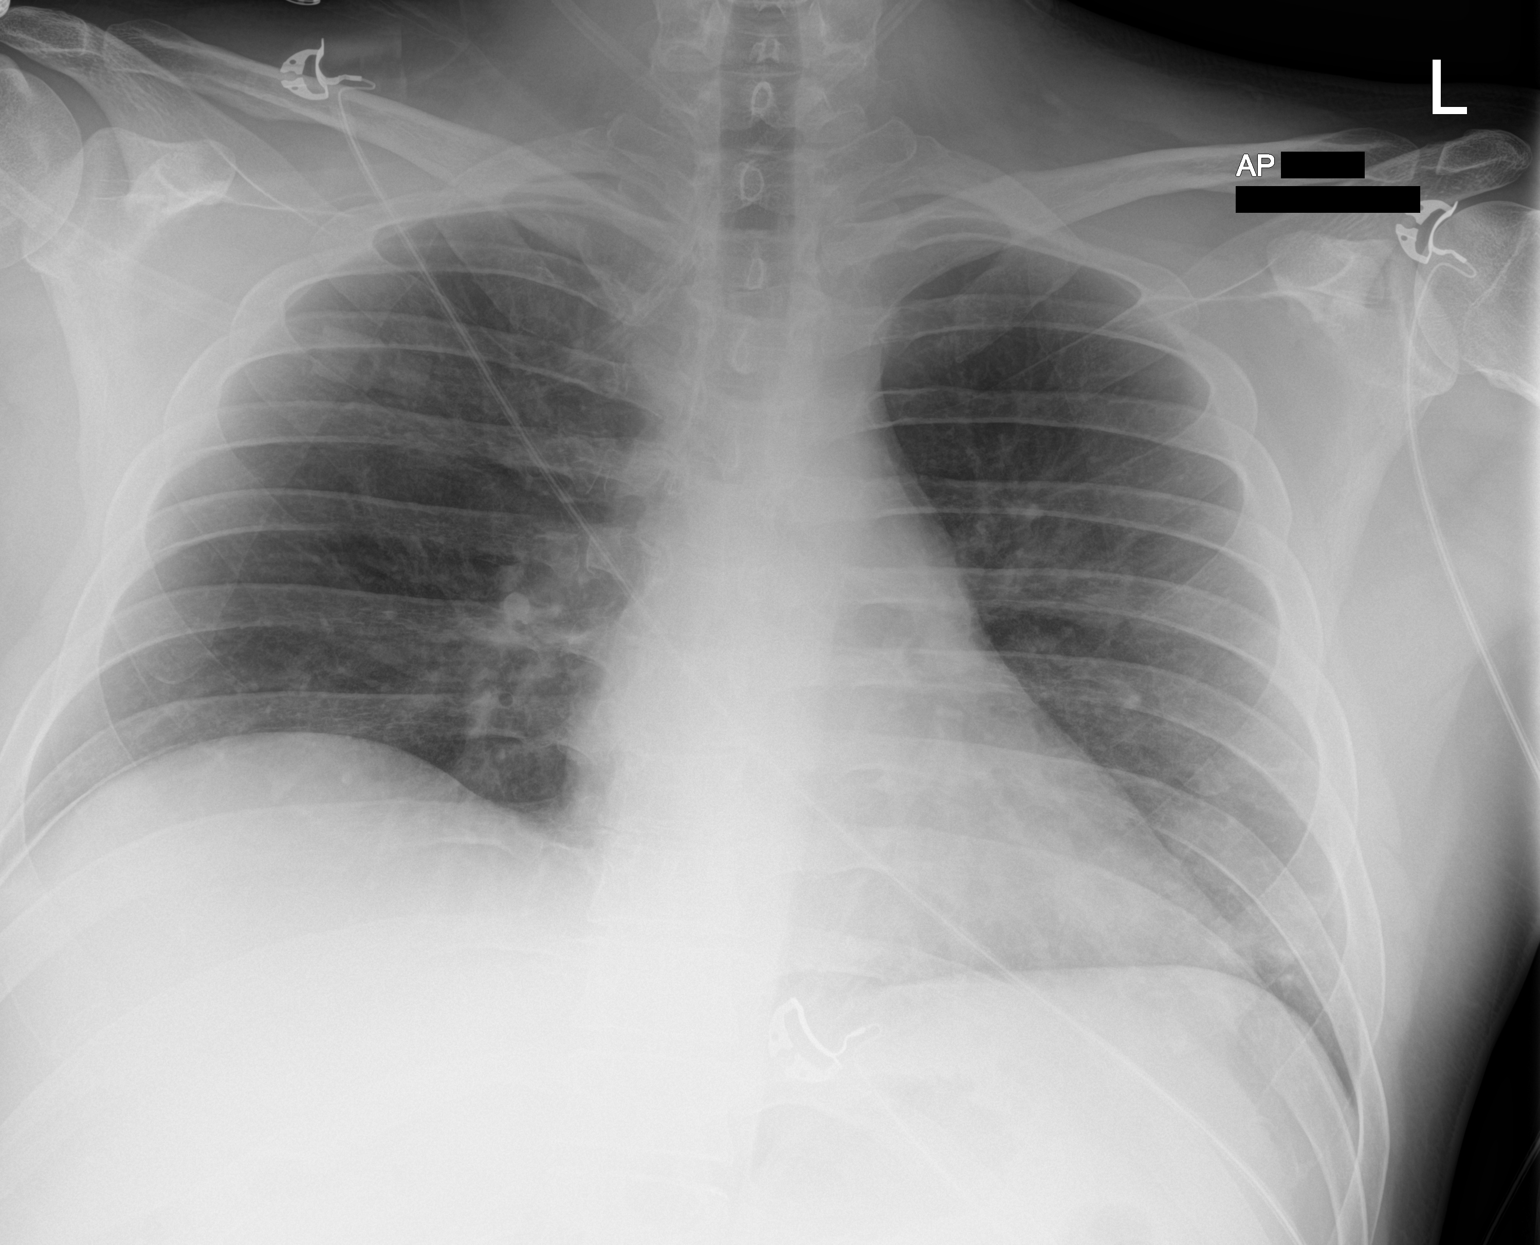

[1 of 1 positions shown; findings below may reference images not displayed]

FINDINGS: There is persistent ill-defined opacity in the left base region.
Subtle ill-defined increased opacity in the right upper lobe is also
present. There is mild interstitial prominence in the left perihilar
region with somewhat equivocal airspace opacity in this area. Heart
size and pulmonary vascularity are normal. No adenopathy. No bone
lesions
IMPRESSION: Ill-defined airspace opacity in the left base and right upper lobe
and more subtly in the left perihilar region. Appearance indicative
of a degree of multifocal pneumonia. Suspect atypical organism
pneumonia as most likely.

Heart size and pulmonary vascular normal. No adenopathy. No bone
lesions.

## 2023-10-02 IMAGING — US US ABDOMEN LIMITED
1 series · 14 of 25 positions shown · non-contrast
Comparison: Previous studies including the examination of
07/30/2012

CLINICAL DATA: Abnormal liver function tests

EXAM:
ULTRASOUND ABDOMEN LIMITED RIGHT UPPER QUADRANT

[Series 1: us abdomen limited · 0.30mm/px · 14 of 47 slices shown]
[im 1/47]
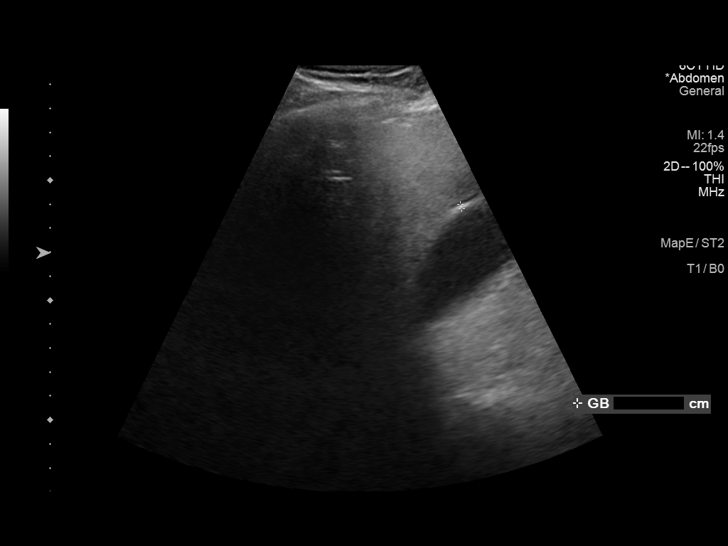
[im 4/47]
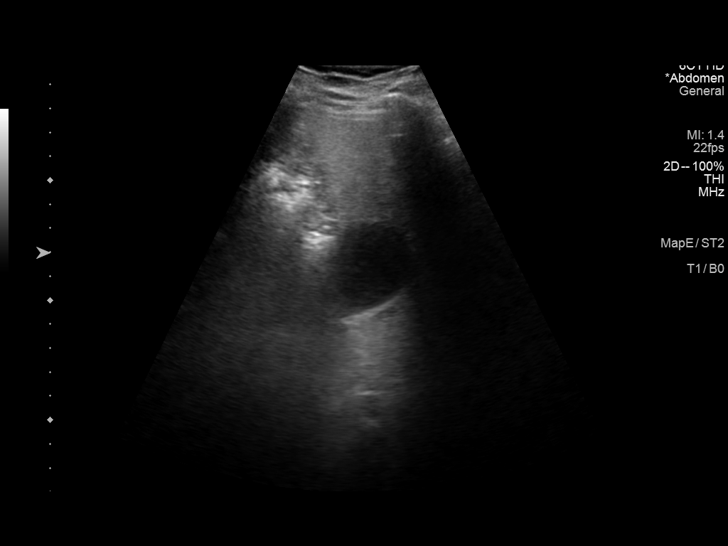
[im 8/47]
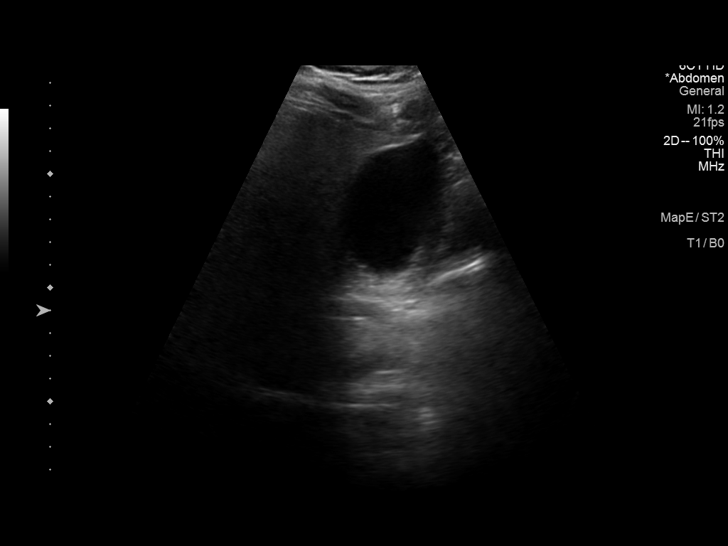
[im 12/47]
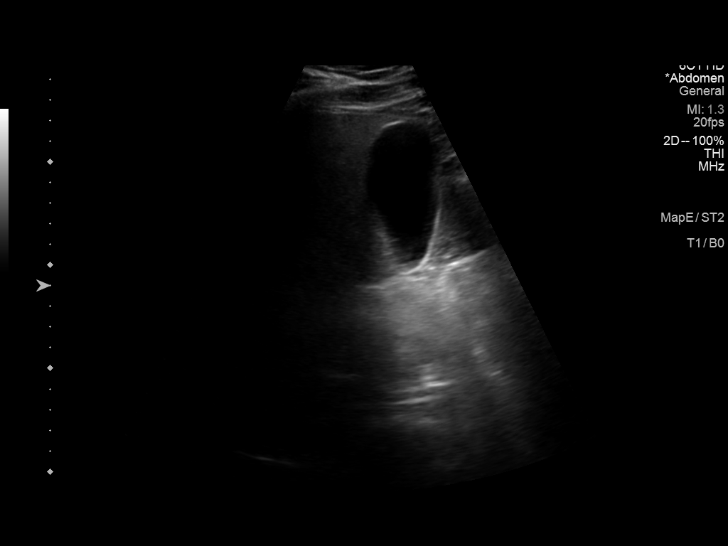
[im 16/47]
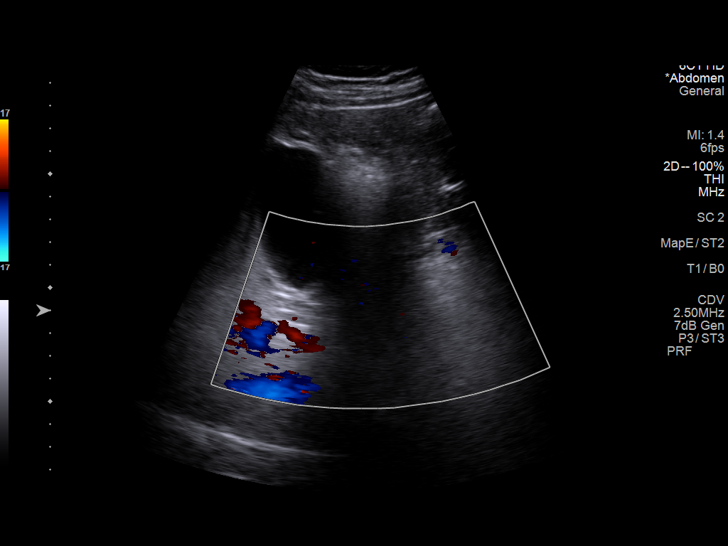
[im 18/47]
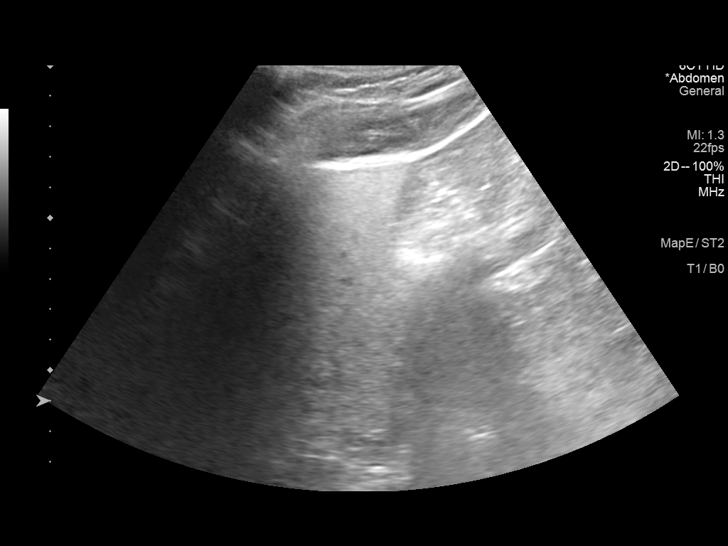
[im 22/47]
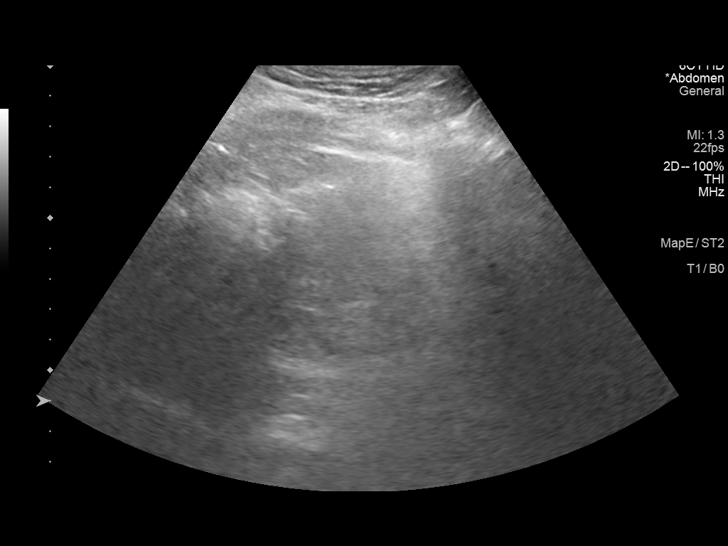
[im 25/47]
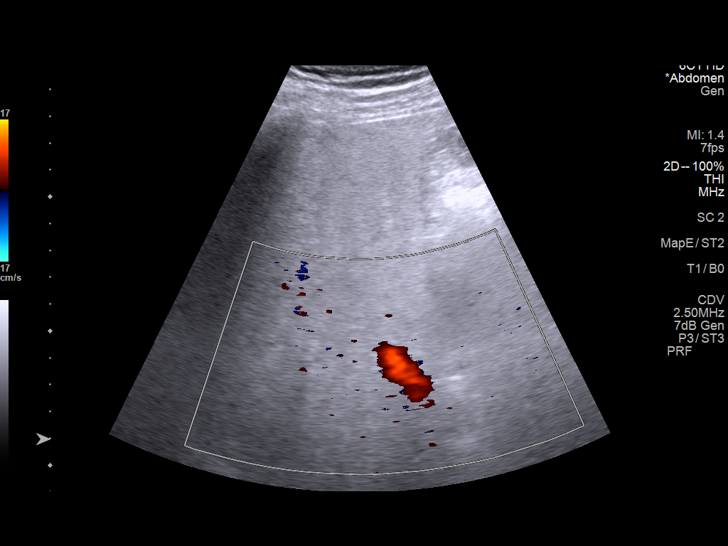
[im 29/47]
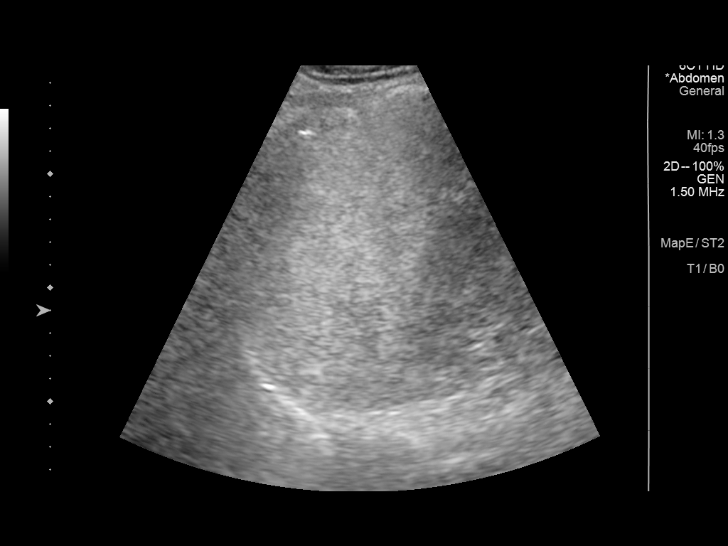
[im 31/47]
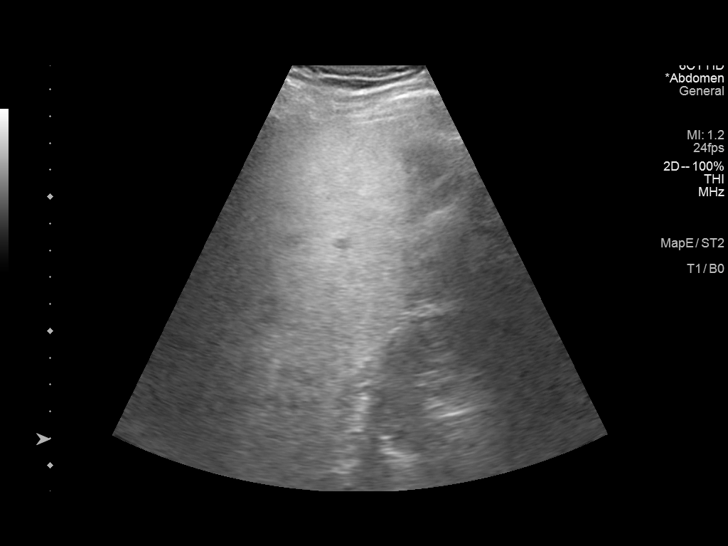
[im 35/47]
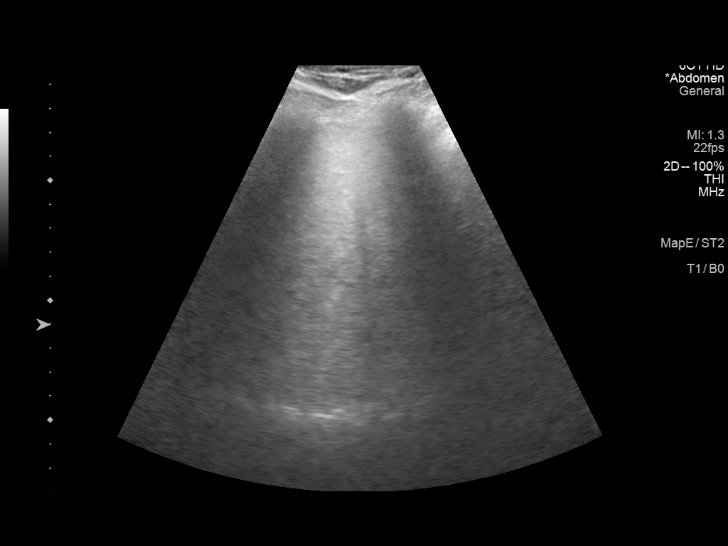
[im 39/47]
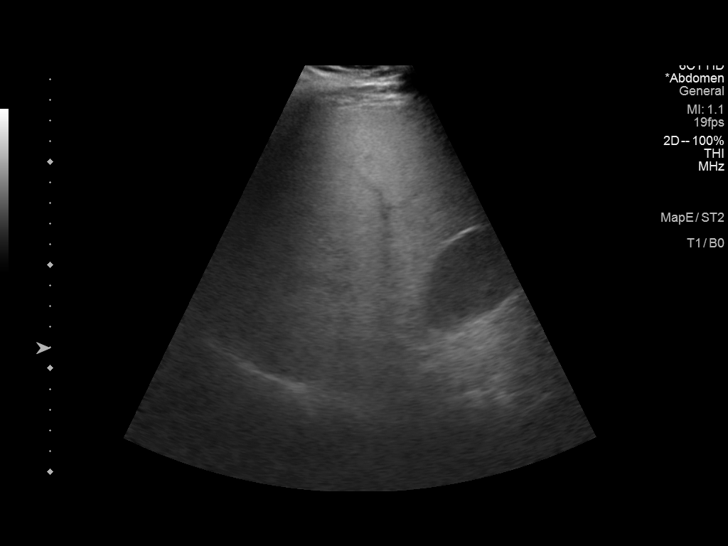
[im 43/47]
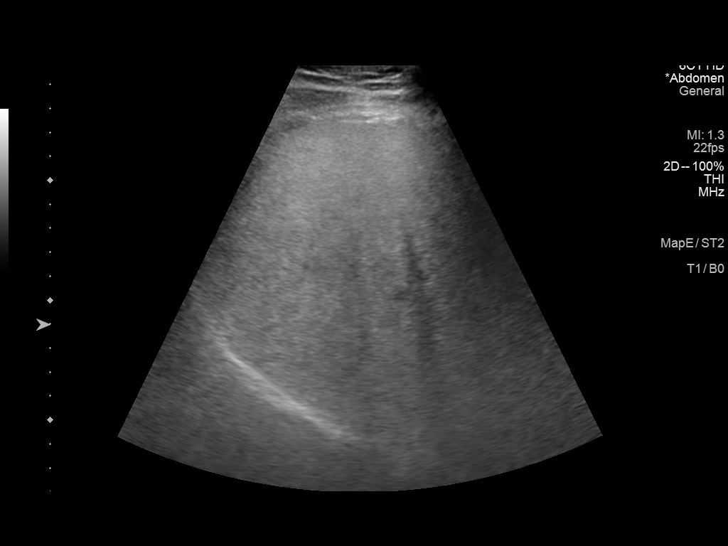
[im 47/47]
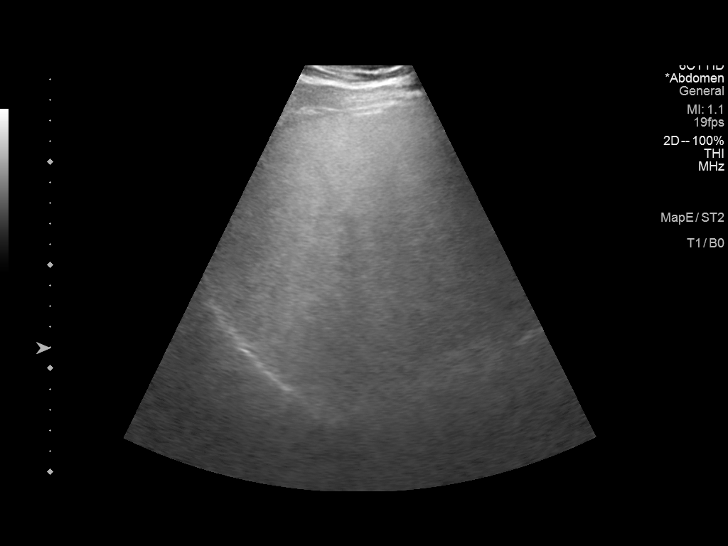

[14 of 25 positions shown; findings below may reference images not displayed]

FINDINGS: Gallbladder:

No gallstones or wall thickening visualized. No sonographic Murphy
sign noted by sonographer.

Common bile duct:

Diameter: 3.8 mm

Liver:

There is increased inhomogeneous echogenicity in the liver
suggesting fatty infiltration. No definite focal abnormality is seen
in the visualized portions of liver. Portal vein is patent on color
Doppler imaging with normal direction of blood flow towards the
liver.

Other: None.
IMPRESSION: Hepatic steatosis. No other sonographic abnormality is seen in the
upper abdomen.
